# Patient Record
Sex: Male | Born: 1935 | Race: White | Hispanic: No | Marital: Married | Smoking: Never smoker
Health system: Southern US, Community
[De-identification: ages and names within clinical notes are randomized; demographics above are authoritative.]

## PROBLEM LIST (undated history)

## (undated) DIAGNOSIS — E079 Disorder of thyroid, unspecified: Secondary | ICD-10-CM

## (undated) DIAGNOSIS — I10 Essential (primary) hypertension: Secondary | ICD-10-CM

## (undated) DIAGNOSIS — I4891 Unspecified atrial fibrillation: Secondary | ICD-10-CM

## (undated) DIAGNOSIS — R059 Cough, unspecified: Secondary | ICD-10-CM

## (undated) DIAGNOSIS — I50812 Chronic right heart failure: Secondary | ICD-10-CM

## (undated) DIAGNOSIS — I2510 Atherosclerotic heart disease of native coronary artery without angina pectoris: Secondary | ICD-10-CM

## (undated) DIAGNOSIS — J90 Pleural effusion, not elsewhere classified: Secondary | ICD-10-CM

## (undated) DIAGNOSIS — E785 Hyperlipidemia, unspecified: Secondary | ICD-10-CM

## (undated) DIAGNOSIS — R05 Cough: Secondary | ICD-10-CM

## (undated) DIAGNOSIS — I639 Cerebral infarction, unspecified: Secondary | ICD-10-CM

## (undated) DIAGNOSIS — I519 Heart disease, unspecified: Secondary | ICD-10-CM

## (undated) HISTORY — DX: Atherosclerotic heart disease of native coronary artery without angina pectoris: I25.10

## (undated) HISTORY — DX: Hyperlipidemia, unspecified: E78.5

## (undated) HISTORY — PX: CHOLECYSTECTOMY: SHX55

## (undated) HISTORY — DX: Disorder of thyroid, unspecified: E07.9

## (undated) HISTORY — DX: Pleural effusion, not elsewhere classified: J90

## (undated) HISTORY — PX: CATARACT EXTRACTION: SUR2

## (undated) HISTORY — DX: Cough: R05

## (undated) HISTORY — DX: Heart disease, unspecified: I51.9

## (undated) HISTORY — DX: Unspecified atrial fibrillation: I48.91

## (undated) HISTORY — DX: Cough, unspecified: R05.9

## (undated) HISTORY — DX: Essential (primary) hypertension: I10

---

## 1992-08-10 HISTORY — PX: CORONARY ARTERY BYPASS GRAFT: SHX141

## 2003-07-11 ENCOUNTER — Encounter: Admission: RE | Admit: 2003-07-11 | Discharge: 2003-10-09 | Payer: Self-pay | Admitting: Family Medicine

## 2003-07-26 ENCOUNTER — Emergency Department (HOSPITAL_COMMUNITY): Admission: EM | Admit: 2003-07-26 | Discharge: 2003-07-26 | Payer: Self-pay | Admitting: Emergency Medicine

## 2004-01-19 ENCOUNTER — Inpatient Hospital Stay (HOSPITAL_COMMUNITY): Admission: EM | Admit: 2004-01-19 | Discharge: 2004-01-21 | Payer: Self-pay | Admitting: Emergency Medicine

## 2004-07-01 ENCOUNTER — Ambulatory Visit: Payer: Self-pay | Admitting: Internal Medicine

## 2004-07-09 ENCOUNTER — Ambulatory Visit: Payer: Self-pay

## 2004-07-14 ENCOUNTER — Ambulatory Visit: Payer: Self-pay | Admitting: Cardiology

## 2004-08-05 ENCOUNTER — Ambulatory Visit: Payer: Self-pay | Admitting: Cardiology

## 2004-08-26 ENCOUNTER — Ambulatory Visit: Payer: Self-pay | Admitting: Internal Medicine

## 2004-09-23 ENCOUNTER — Ambulatory Visit: Payer: Self-pay | Admitting: Cardiology

## 2004-10-07 ENCOUNTER — Ambulatory Visit: Payer: Self-pay | Admitting: *Deleted

## 2004-10-14 ENCOUNTER — Ambulatory Visit: Payer: Self-pay | Admitting: Cardiology

## 2004-10-17 ENCOUNTER — Ambulatory Visit: Payer: Self-pay | Admitting: Cardiology

## 2004-10-17 ENCOUNTER — Ambulatory Visit: Payer: Self-pay

## 2004-10-20 ENCOUNTER — Ambulatory Visit: Payer: Self-pay | Admitting: Cardiology

## 2004-11-10 ENCOUNTER — Ambulatory Visit: Payer: Self-pay | Admitting: Cardiology

## 2004-12-08 ENCOUNTER — Ambulatory Visit: Payer: Self-pay | Admitting: Cardiology

## 2004-12-22 ENCOUNTER — Ambulatory Visit: Payer: Self-pay | Admitting: Cardiology

## 2004-12-29 ENCOUNTER — Ambulatory Visit (HOSPITAL_BASED_OUTPATIENT_CLINIC_OR_DEPARTMENT_OTHER): Admission: RE | Admit: 2004-12-29 | Discharge: 2004-12-29 | Payer: Self-pay | Admitting: Urology

## 2004-12-29 ENCOUNTER — Ambulatory Visit (HOSPITAL_COMMUNITY): Admission: RE | Admit: 2004-12-29 | Discharge: 2004-12-29 | Payer: Self-pay | Admitting: Urology

## 2005-01-01 ENCOUNTER — Ambulatory Visit (HOSPITAL_COMMUNITY): Admission: RE | Admit: 2005-01-01 | Discharge: 2005-01-01 | Payer: Self-pay | Admitting: Urology

## 2005-01-14 ENCOUNTER — Ambulatory Visit: Payer: Self-pay | Admitting: Cardiology

## 2005-01-21 ENCOUNTER — Ambulatory Visit: Payer: Self-pay | Admitting: *Deleted

## 2005-01-28 ENCOUNTER — Ambulatory Visit: Payer: Self-pay | Admitting: *Deleted

## 2005-01-29 ENCOUNTER — Ambulatory Visit (HOSPITAL_COMMUNITY): Admission: RE | Admit: 2005-01-29 | Discharge: 2005-01-29 | Payer: Self-pay | Admitting: Urology

## 2005-02-05 ENCOUNTER — Ambulatory Visit: Payer: Self-pay | Admitting: Cardiology

## 2005-02-13 ENCOUNTER — Ambulatory Visit: Payer: Self-pay | Admitting: Cardiology

## 2005-02-25 ENCOUNTER — Ambulatory Visit: Payer: Self-pay | Admitting: Cardiovascular Disease

## 2005-02-26 ENCOUNTER — Ambulatory Visit (HOSPITAL_COMMUNITY): Admission: RE | Admit: 2005-02-26 | Discharge: 2005-02-26 | Payer: Self-pay | Admitting: Urology

## 2005-02-28 ENCOUNTER — Emergency Department (HOSPITAL_COMMUNITY): Admission: EM | Admit: 2005-02-28 | Discharge: 2005-02-28 | Payer: Self-pay | Admitting: Emergency Medicine

## 2005-03-04 ENCOUNTER — Inpatient Hospital Stay (HOSPITAL_COMMUNITY): Admission: EM | Admit: 2005-03-04 | Discharge: 2005-03-06 | Payer: Self-pay | Admitting: Urology

## 2005-03-04 ENCOUNTER — Ambulatory Visit: Payer: Self-pay | Admitting: Cardiology

## 2005-03-11 ENCOUNTER — Ambulatory Visit: Payer: Self-pay | Admitting: *Deleted

## 2005-03-20 ENCOUNTER — Ambulatory Visit: Payer: Self-pay | Admitting: Cardiology

## 2005-04-03 ENCOUNTER — Ambulatory Visit: Payer: Self-pay | Admitting: Internal Medicine

## 2007-11-17 ENCOUNTER — Encounter: Payer: Self-pay | Admitting: Cardiology

## 2010-03-25 ENCOUNTER — Emergency Department (HOSPITAL_BASED_OUTPATIENT_CLINIC_OR_DEPARTMENT_OTHER): Admission: EM | Admit: 2010-03-25 | Discharge: 2010-03-25 | Payer: Self-pay | Admitting: Emergency Medicine

## 2010-03-25 ENCOUNTER — Ambulatory Visit: Payer: Self-pay | Admitting: Diagnostic Radiology

## 2010-07-30 ENCOUNTER — Encounter: Payer: Self-pay | Admitting: Cardiology

## 2010-09-17 NOTE — Letter (Signed)
Summary: [facility]  [facility]   Imported By: Lenard Forth 09/10/2010 10:51:31  _____________________________________________________________________  External Attachment:    Type:   Image     Comment:   External Document

## 2010-12-26 NOTE — H&P (Signed)
NAMEPELHAM, HENNICK NO.:  [id number]   MEDICAL RECORD NO.:  [id number]          PATIENT TYPE:  INP   LOCATION:  1431                         FACILITY:  [facility]   PHYSICIAN:  Bertram Millard. Dahlstedt, M.D.DATE OF BIRTH:  Oct 21, 1923   DATE OF ADMISSION:  03/04/2005  DATE OF DISCHARGE:                                HISTORY & PHYSICAL   REASON FOR ADMISSION:  Feeling bad.   BRIEF HISTORY:  A 77 year old male who has had a significant amount of  difficulty with infections and stones recently.  He initially presented to  my attention in June, 2005 with a history of urinary retention.  That  required a catheter, and he failed a voiding trial.  He was continued on  Flomax and eventually voided.  He has had intermittent voiding symptoms  since that time as well as some microscopic hematuria and symptoms of  possible UTI.  Evaluation included an ultrasound showing significant left  hydro with a very large left renal pelvic stone.  He has obvious BPH.  The  patient has undergone three lithotripsies recently.  He has had a stent  placed, which has been indwelling.  The first and third lithotripsies had  significant success.  The second lithotripsy did not.  His most recent  lithotripsy was six days ago.  He was seen yesterday in the office.  At that  time, he had a recent history of an enterococcal UTI which was treated here  through the emergency room at [facility] on Saturday of this past week.  He  has been placed on Augmentin.  He had some low-grade temperatures.  KUB  yesterday revealed no significant residual fragments of the left kidney  stones, which measured at one point above 2 cm in size.   He went home yesterday, having been switched from Augmentin to  nitrofurantoin due to GI issues.  He presented today after he called in  because of fever to 101.6.  He just has not been feeling well and on  evaluation in the office today, he looked quite sick.  He is not  febrile,  but he looked pale.  Because of his age, medical condition, and recent  infection, it was recommended that he be admitted for further evaluation and  management.   PAST MEDICAL HISTORY:  1.  CVA.  He has right arm and leg partial paralysis.  2.  History of a CABG x4 in 1999.  3.  Back surgery in June, 2005, which precipitated his retention.  4.  He had lithotripsy three times in the past two months.  5.  History of hypothyroidism.  6.  Type 2 diabetes.  7.  History of melanoma which was excised three years ago.   MEDICATIONS:  1.  Macrobid 1 p.o. b.i.d.  2.  Glucophage 850 mg p.o. b.i.d.  3.  Levothyroxine 100 mcg p.o. q.a.m.  4.  Lovastatin 20 mg p.o. q.p.m.  5.  Atenolol 25 mg p.o. q.a.m.  6.  Coumadin 4 mg on Monday, Tuesday, and Thursday nights, 2 mg on Wednesday  and Friday nights.  7.  Proscar 5 mg daily.  8.  Flomax 0.4 mg q.h.s.   He denies any drug allergies except for STADOL.  He denies any latex  allergies.   He is married.  He has one daughter.  He is retired and was in [employer].  He is a native of [place], [place].   FAMILY HISTORY:  Significant for heart disease, diabetes, and hypertension.   REVIEW OF SYSTEMS:  He has had some weakness recently, shortness of breath.  He has also had some left flank discomfort since his lithotripsy.  He denies  any gross hematuria.  He denies any right-sided pain.  He denies any  vomiting in the past 2-3 days but has had some nausea.  He has had decreased  appetite.  He has had no chest pain.   PHYSICAL EXAMINATION:  VITAL SIGNS:  Blood pressure 142/83, pulse 102.  He  was afebrile.  He weighed 170 pounds.  GENERAL:  A mildly ill-appearing elderly male.  NECK:  Supple.  LUNGS:  Clear bilaterally.  HEART:  Slightly increased rhythm and increased rate with an irregular  rhythm.  ABDOMEN:  Mildly protuberant, soft, nondistended with minimal left CVA and  left lower quadrant tenderness.   No rebound or guarding.  No  hepatosplenomegaly or mass.  No significant flank hematoma noted.  GENITOURINARY:  External genitalia normal.  RECTAL:  Deferred.   ADMISSION DATA:  Hemoglobin was 8.4, hematocrit 24.8, white count 7900.  INR  2.7 with a pro time of 28.9.  BMET was normal except for a sodium of 134 and  a glucose of 154.  Urine culture from four days ago in the emergency room  grew Enterococcus sensitive to ampicillin, Levaquin, vancomycin, and  nitrofurantoin.   IMPRESSION:  1.  Urinary tract infection.  2.  A large left renal calculus, status post lithotripsy x3.  Now basically      stone-free except for a few fragments in his kidney.  3.  Benign prostatic hypertrophy with a history of retention.  4.  History of cerebrovascular accident.  5.  Atrial fibrillation.  6.  Hypothyroidism.   PLAN:  1.  Will admit and start on vancomycin for infection.  2.  While he is in the hospital, will possibly remove stent.  3.  Will recheck the patient's hemoglobin in the morning.  If it is still      low, will transfuse.  4.  Will add supplemental oxygen.       SMD/MEDQ  D:  03/04/2005  T:  03/04/2005  Job:  [id number]

## 2010-12-26 NOTE — Op Note (Signed)
NAMEORLEY, LAWRY NO.:  [id number]   MEDICAL RECORD NO.:  [id number]          PATIENT TYPE:  AMB   LOCATION:  NESC                         FACILITY:  [facility]   PHYSICIAN:  Bertram Millard. Dahlstedt, M.D.DATE OF BIRTH:  1924-02-18   DATE OF PROCEDURE:  12/29/2004  DATE OF DISCHARGE:                                 OPERATIVE REPORT   PREOPERATIVE DIAGNOSIS:  Left renal pelvic/staghorn calculus.   POSTOPERATIVE DIAGNOSIS:  Left renal pelvic/staghorn calculus.   PRINCIPAL PROCEDURE:  Cystoscopy, placement of double-J stent in left  ureter.   SURGEON:  Bertram Millard. Dahlstedt, M.D.   ANESTHESIA:  General with LMA.   COMPLICATIONS:  None.   BRIEF HISTORY:  A 77 year old male who has had intermittent urinary symptoms  as well as urinary infections over the past several months to a year. He had  urinary retention when I first saw him last summer. Evaluation was done  because of the patient's recurrent urinary tract infections and microscopic  hematuria. Additionally, he has had left flank pain. Last week I saw him and  he had a staghorn stone seen in this kidney. He presents at this time for  cystoscopy to place a stent. He is scheduled for lithotripsy later this  week. He is aware of the options of treatment including open surgery,  percutaneous nephrolithotomy and lithotripsy. He has chosen to have  lithotripsy.   DESCRIPTION OF PROCEDURE:  The patient was taken to the OR after  preoperative IV antibiotics were administered. General anesthetic was  administered using the LMA. He was placed in the dorsal lithotomy position.  Genitalia and perineum were prepped and draped. A 22-French panendoscope was  advanced into his bladder. Prostate had an enlarged median lobe with minimal  obstruction. Trabeculations were seen throughout. No tumors or foreign  bodies were noted. The left ureteral orifice was identified and a 0.38 inch  guidewire was placed and advanced past the  stone at the left renal pelvis.  Over top of this a 24 cm x 6-French double-J stent was placed. The string  was removed. Good curls were seen proximally and distally using fluoroscopic  and cystoscopic guidance after the guidewire was removed. The bladder was  then drained, the scope removed.   The patient tolerated procedure well. He was awakened, extubated, and taken  to the PACU in stable condition.      SMD/MEDQ  D:  12/29/2004  T:  12/29/2004  Job:  [id number]

## 2010-12-26 NOTE — Op Note (Signed)
NAME:  Eric Bowers, Eric Bowers NO.:  [id number]   MEDICAL RECORD NO.:  [id number]                   PATIENT TYPE:  INP   LOCATION:  5007                                 FACILITY:  [facility]   PHYSICIAN:  Tia Alert, MD                  DATE OF BIRTH:  1924/07/29   DATE OF PROCEDURE:  01/20/2004  DATE OF DISCHARGE:                                 OPERATIVE REPORT   PREOPERATIVE DIAGNOSES:  1. Lumbar spinal stenosis, L4-5 with lumbar disk herniation L4-5 on the     left.  2. Back pain.  3. Bilateral leg pain.  4. Urinary retention.   POSTOPERATIVE DIAGNOSES:  1. Lumbar spinal stenosis, L4-5 with lumbar disk herniation L4-5 on the     left.  2. Back pain.  3. Bilateral leg pain.  4. Urinary retention.   PROCEDURE:  1. Decompressive lumbar hemilaminectomy.  2. Medial facetectomy.  3. Foraminotomy at L4-5 on the left followed by sublaminar decompression L4-     5, central canal decompression, and right lateral recess decompression     and microdiskectomy at L4-5 on the left utilizing microscopic dissection.   SURGEON:  Dr. Marikay Alar.   ANESTHESIA:  General endotracheal.   COMPLICATIONS:  None apparent.   INDICATIONS FOR THE PROCEDURE:  Eric Bowers is a 77 year old white male, who  presented with a 2 week history of progressively worsening back pain to the  point that he could not get out of bed and ambulate.  He had one episode of  urinary incontinence and had a Foley catheter placed in the emergency  department when he presented with severe pain in his back and legs.  He had  a an MRI which showed severe spinal stenosis at L4-5 from a combination of  herniated disk L4-5 on the left, ligamentum flavum hypertrophy, and facet  arthropathy.  He was on Coumadin for atrial fibrillation and had multiple  comorbidities.  He was admitted, given FFP to reverse his coagulopathy, and  consented for a lumbar decompressive laminectomy at L4-5 with disk  __________  L4-5.  The patient understood the risks, benefits, and  alternatives and wished to proceed.   DESCRIPTION OF PROCEDURE:  The patient was taken to the operating room and  after induction of adequate generalized endotracheal anesthesia, he was  rolled in a prone position on the [employer], and all pressure points were  padded.  His lumbar region was prepped with DuraPrep and then draped in the  usual sterile fashion; 8 mL of local anesthesia was injected, and then a  dorsal midline incision was made over the L4-5 interspace, and the  paraspinous musculature was then taken down in a subperiosteal fashion to  expose the L4-5 interspace.  Internal x-ray confirmed our level, and then I  used a combination of the Kerrison punches and the high-speed air power  Black Max drill  to perform a hemilaminotomy, medial facetectomy, and  foraminotomy at L4-5 on the left.  I was then able to drill up under the  spinous process and then used the Kerrison punch to bite the __________ away  in the lateral recess on the right side out to the medial pedicle level to  perform a central canal and right lateral recess decompression to address  the central spinal stenosis.  Once the decompression was complete, I gently  retracted the L5 nerve root on the left medially and coagulated the epidural  venous vasculature and found a fairly significant subannular disk  herniation.  The annulus was incised with a 15 blade scalpel, and a thorough  intradiskal diskectomy was performed with pituitary rongeurs and curettes.  Once the diskectomy was completed, I irrigated with copious amounts of  Bacitracin-containing saline solution and inspected the nerve roots and the  midline once again with a coronary dilator palpater and then lined the dura  with Gelfoam, removed the retractor, dried all bleeding points, and then  closed the fascia with interrupted #1 Vicryl, closed the subcutaneous and  subcuticular tissue with 2-0 and  3-0 Vicryl, and closed the skin with  Dermabond.  The drapes were removed.  Sterile dressing was applied.  The  patient was awakened from general anesthesia and transported to the recovery  room in stable condition.  At the end of the procedure, all sponge, needle,  and instrument counts were correct.                                               Tia Alert, MD    DSJ/MEDQ  D:  01/20/2004  T:  01/20/2004  Job:  [id number]

## 2010-12-26 NOTE — Discharge Summary (Signed)
NAMEJUVENCIO, Eric Bowers NO.:  [id number]   MEDICAL RECORD NO.:  [id number]          PATIENT TYPE:  INP   LOCATION:  1431                         FACILITY:  [facility]   PHYSICIAN:  Bertram Millard. Dahlstedt, M.D.DATE OF BIRTH:  09/10/1923   DATE OF ADMISSION:  03/04/2005  DATE OF DISCHARGE:  03/06/2005                                 DISCHARGE SUMMARY   PRIMARY DIAGNOSES:  1.  Left perirenal hematoma.  2.  Left renal calculi.  3.  Left hydronephrosis.  4.  Benign prostatic hypertrophy.  5.  History of cerebrovascular accident.  6.  Coronary artery disease.  7.  Diabetes mellitus type 2.  8.  History of skin melanoma.  9.  History of urinary tract infection.   PROCEDURES.:  Transfusion.   BRIEF HISTORY:  [name] is a 77 year old gentleman who has had significant  difficulty with left renal calculi and sequelae there of. He initially  presented to my attention in June 2005 with urinary retention while in the  hospital. He was eventually taken off of catheter and has been voiding with  difficulty since that time. Evaluation for recurrent UTIs and microscopic  hematuria revealed a very large left renal pelvic stone with hydronephrosis.  He also has BPH.   The patient has undergone three lithotripsies, most recently just a week or  to ago. He recently presented to my office for follow-up and was found to  just not be feeling well at all. He was having low grade fevers, and his  energy level has diminished. He is on Coumadin for a history of CVA. He has  had a fever recently to 101.6.   For full history and physical, please see the dictated note from March 04, 2005.   HOSPITAL COURSE:  Urine culture from slightly before this admission revealed  enterococcus which was sensitive to ampicillin, nitrofurantoin, vancomycin,  and Levaquin. The patient was started on vancomycin. He was found to have a  hemoglobin of 8.4. The day after admission, his hemoglobin was down to  7.3,  with hydration. He was transfused a total of 4 units of packed red blood  cells while in the hospital. He was found to have a hematoma around his left  kidney on ultrasound.   After the patient was transfused, he felt much better. He actually received  a total of 5 units of blood. After 2 days of hospitalization, he felt much  better, he was afebrile, and he was felt ready for discharge.   Discharge medications include all his regular medications including Coumadin  plus Levaquin 250 mg once daily. He has 500 mg tablets at home and he will  split these in half. My office will call him to set up an appointment the  following week.      Bertram Millard. Dahlstedt, M.D.  Electronically Signed     SMD/MEDQ  D:  05/12/2005  T:  05/12/2005  Job:  [id number]

## 2010-12-26 NOTE — H&P (Signed)
NAME:  Eric Bowers, KIMBRELL NO.:  [id number]   MEDICAL RECORD NO.:  [id number]                   PATIENT TYPE:  INP   LOCATION:  5007                                 FACILITY:  [facility]   PHYSICIAN:  Tia Alert, MD                  DATE OF BIRTH:  1923-12-05   DATE OF ADMISSION:  01/19/2004  DATE OF DISCHARGE:                                HISTORY & PHYSICAL   CHIEF COMPLAINT:  Back pain.   HISTORY OF PRESENT ILLNESS:  [name] is a 77 year old white male with a  history of cerebrovascular accident, coronary artery bypass grafting,  hypertension, diabetes mellitus, hypothyroidism, and atrial fibrillation on  Coumadin who presents with a two week history of progressively worsening low  back pain which radiates into his bilateral lower extremities.  He states  the pain radiates into the posterior thighs.  He denies any significant  numbness and tingling.  He denies any perineal numbness or tingling.  However, he does report that  he had an episode of incontinence earlier  today, however, he has urinated while in the emergency department.  He  denies any fever and chills.  He does complain of some dysuria, but his  urinalysis in the emergency department was negative for infection.  He  states that the pain has gotten progressively worse.  He takes Vicodin and  Flexeril for the pain and has seen a chiropractor, however, it has gotten  progressively worse to the point that he could not get out of bed and  ambulate today.  He had an MRI in the emergency department which showed  severe spinal stenosis with a herniated disk at L4-L5.  A neurosurgical  consultation was requested.   PAST MEDICAL HISTORY:  1. Appendectomy.  2. Tonsillectomy.  3. Coronary artery disease, status post coronary artery bypass grafting.  4. Dyslipidemia.  5. Melanoma, status post removal 2-3 years ago.  6. Adult onset diabetes mellitus.  7. Cerebrovascular accident with right sided  weakness.  8. Hypertension.  9. Hyperthyroidism.  10.      Atrial fibrillation.   MEDICATIONS:  1. Aspirin 81 mg per day.  2. Atenolol 25 mg p.o. b.i.d.  3. Lipitor 20 mg p.o. every day.  4. Synthroid 100 mcg p.o. every day.  5. Flexeril p.r.n.  6. Vicodin p.r.n.  7. Glucophage 850 mg p.o. b.i.d.  8. Coumadin 4 mg p.o. every day.   ALLERGIES:  STADOL.   SOCIAL HISTORY:  He denies the use of tobacco or alcohol products.   PHYSICAL EXAMINATION:  VITAL SIGNS:  Temperature 97.7, pulse 97,  respirations 18, oxygen saturation 98%.  GENERAL:  A very pleasant, cooperative, white male in no acute distress.  HEENT:  Normocephalic, atraumatic.  Extraocular movements are intact without  nystagmus.  He does have a mild right facial droop.  NECK:  Supple.  HEART:  Regular rate and  rhythm.  ABDOMEN:  Benign.  EXTREMITIES:  Show no clubbing, cyanosis, or edema.  NEUROLOGIC:  He is awake and alert.  He is oriented x 4.  There is no  aphasia.  He has good attention span.  Again, he has some mild right facial  weakness but his tongue protrudes in the midline.  He can puff out his  cheeks.  He has good shoulder shrug.  His strength is decreased on the right  side, and he is spastic on his right side, face and arm greater than leg.  His strength is not quite antigravity on the right side.  His left side has  good strength in his upper and lower extremity with good muscle tone and  bulk.  He has a positive Hoffmann sign on the right, negative on the left.  Sensation is okay.  His gait is not tested.  His rectal tone is normal.   IMAGING STUDIES:  He had an MRI of the lumbar spine which I have reviewed as  well as the radiologist report.  It shows degenerative disk disease  throughout with some mild spondylosis throughout, except at L4-L5 where he  has quite severe stenosis from broad based disk herniation paracentral to  the left, ligamentum flavum hypertrophy, and facette hypertrophy.    ASSESSMENT/PLAN:  This is a 77 year old white male who has severe spinal  stenosis at L4-L5 with severe back pain and bilateral leg pain, especially  when standing.  He has gotten to the point that he can not get out of bed  because of his pain.  He has severe spinal stenosis at L4-L5.  I recommend a  decompressive laminectomy at L4-L5 with diskectomy at L4-L5 on the left.  He  understands the risks of the surgery including bleeding, infection,  __________ injury, CSF leak, lack of release symptoms, worsening symptoms,  and anesthesia risk, and he wishes to proceed.  We will admit him and place  him on Decadron for the pain.  We will cover him with sliding scale insulin  for his diabetes mellitus.  He will be NPO after midnight and we will  consent him for the surgery.  We will also take him off of his Coumadin and  reverse his coagulopathy with fresh frozen plasma.  We will also hold his  aspirin.  Because he has urinary incontinence, we will check his post void  residual in the emergency department, and if his residual is greater than  300 cc, we will leave the Foley catheter in.                                                Tia Alert, MD    DSJ/MEDQ  D:  01/19/2004  T:  01/20/2004  Job:  [id number]

## 2011-05-04 ENCOUNTER — Telehealth: Payer: Self-pay | Admitting: Cardiology

## 2011-05-04 NOTE — Telephone Encounter (Signed)
Please fax last OV Note, EKG, ECHO, STRESS, if at all available.

## 2011-05-04 NOTE — Telephone Encounter (Signed)
Faxed over last OV Note, EKG, and Stress Test that were available today.

## 2011-06-05 ENCOUNTER — Institutional Professional Consult (permissible substitution): Payer: Self-pay | Admitting: Internal Medicine

## 2011-06-12 ENCOUNTER — Encounter: Payer: Self-pay | Admitting: Internal Medicine

## 2011-06-15 ENCOUNTER — Institutional Professional Consult (permissible substitution): Payer: Self-pay | Admitting: Internal Medicine

## 2011-09-07 ENCOUNTER — Ambulatory Visit (INDEPENDENT_AMBULATORY_CARE_PROVIDER_SITE_OTHER): Payer: Medicare Other | Admitting: Physician Assistant

## 2011-09-07 ENCOUNTER — Encounter: Payer: Self-pay | Admitting: Physician Assistant

## 2011-09-07 DIAGNOSIS — R600 Localized edema: Secondary | ICD-10-CM

## 2011-09-07 DIAGNOSIS — E785 Hyperlipidemia, unspecified: Secondary | ICD-10-CM

## 2011-09-07 DIAGNOSIS — I10 Essential (primary) hypertension: Secondary | ICD-10-CM | POA: Insufficient documentation

## 2011-09-07 DIAGNOSIS — I482 Chronic atrial fibrillation, unspecified: Secondary | ICD-10-CM

## 2011-09-07 DIAGNOSIS — I4891 Unspecified atrial fibrillation: Secondary | ICD-10-CM

## 2011-09-07 DIAGNOSIS — E119 Type 2 diabetes mellitus without complications: Secondary | ICD-10-CM

## 2011-09-07 DIAGNOSIS — I519 Heart disease, unspecified: Secondary | ICD-10-CM

## 2011-09-07 DIAGNOSIS — R609 Edema, unspecified: Secondary | ICD-10-CM

## 2011-09-07 DIAGNOSIS — E039 Hypothyroidism, unspecified: Secondary | ICD-10-CM

## 2011-09-07 DIAGNOSIS — I639 Cerebral infarction, unspecified: Secondary | ICD-10-CM

## 2011-09-07 HISTORY — DX: Heart disease, unspecified: I51.9

## 2011-09-07 LAB — POCT INR: INR: 3

## 2011-09-07 LAB — POCT GLYCOSYLATED HEMOGLOBIN (HGB A1C): Hemoglobin A1C: 7.1

## 2011-09-07 NOTE — Patient Instructions (Addendum)
Labs drawn.. Will call with results. Follow up in 1 month for Coumadin Recheck. Follow up 1 month to talk more about depression. Take lasix twice a week for swelling.

## 2011-09-07 NOTE — Progress Notes (Signed)
Subjective:    Patient ID: Eric Bowers, male    DOB: [birth year], 78 y.o.   MRN: [id number]  HPI Patient presents to the clinic to establish care today. His doctor moved in any for one to manage his medications. Patient reports he had bypass surgery on his heart 25 years ago and since has been on Coumadin. He takes Coumadin 3 mg 1-1/2 tabs on Saturday and Sunday with 1 tab on all the other days. He has been on this dose for a long time. He recently had a procedure to drain fluid off his abdomen and had to go off Coumadin. He went back on the Coumadin 2 weeks ago.   He is a chronic problem of swelling of his extremities but does not like to take diuretics. His wife states that she has to make him take frusemide when his legs get really swollen. He has compression hose but his wife in not get them on him.  He has been diagnosed with diabetes but it has been well controlled in the past. He went off metformin 6 months ago because of the good readings. He has changed his portion sizes but nothing else.  We talked about depression today. He he reports that he does still male and somewhat hopeless. He lost about $100,000 a couple years ago with the market crash and this bothers him. He also has had 2 of his daughters died. He has trouble sleeping at night because he thinks about everything in his life. His wife states that he evening gets mad at her for no reason. He has not been on medication in the past for depression.   He does not need  refills today.   Review of Systems     Objective:   Physical Exam  Constitutional: He is oriented to person, place, and time. He appears well-developed and well-nourished.  HENT:  Head: Normocephalic and atraumatic.  Cardiovascular: Normal rate and intact distal pulses.        Atrial fibrillation with normal rate. No murmurs detected. 1+ pitting edema bilateral legs.  Pulmonary/Chest: Effort normal and breath sounds normal. He has no wheezes.    Musculoskeletal:       Right side weakness in both arm and leg extremities due to stroke history.  Neurological: He is alert and oriented to person, place, and time.  Skin:       Venous statis of bilateral legs. Multiple brusing in all extremities.   Psychiatric: His behavior is normal.       Flat affect.          Assessment & Plan:  Swelling of bilateral legs-ordered CMP to check renal function. We'll call with results. Patient has HCTZ and Lasix. He is unaware if she takes these on a daily basis. His wife states she has to make him take fluid pills. He does have compression stockings but they are too hard to get on. Instructed patient to start taking Lasix twice a week to help decrease swelling. The swelling does decrease where he can put compression stockings on that would also help.  Depression-PHQ-9 was 7. patient did not want to take any more pills especially for depression. I discussed with patient the option of psychotherapy with a counselor he declined. Follow-up in one month. Spoke with wife about patient's depression with his permission and she was very concerned. She states that he has changed and just does not want to do anything anymore. He has even threatened to leave her recently and  she does not even know where he would go.  Hypothyroidism-ordered TSH. We'll call with results.  Diabetes mellitus type 2-hemoglobin A1c 7.2. Patient has not own any medications for diabetes. Does not want to go back on the metformin right now. He wants to try to control his sugars with diet and portion control.  Atrial fibrillation, controlled-on Coumadin. INR was 3 today. Keep on same dose and recheck in 1 month.

## 2011-09-08 LAB — COMPREHENSIVE METABOLIC PANEL
Alkaline Phosphatase: 126 U/L — ABNORMAL HIGH (ref 39–117)
CO2: 20 mEq/L (ref 19–32)
Glucose, Bld: 102 mg/dL — ABNORMAL HIGH (ref 70–99)
Sodium: 140 mEq/L (ref 135–145)
Total Bilirubin: 2.1 mg/dL — ABNORMAL HIGH (ref 0.3–1.2)

## 2011-09-08 LAB — LIPID PANEL
Cholesterol: 90 mg/dL (ref 0–200)
Total CHOL/HDL Ratio: 3 Ratio
VLDL: 13 mg/dL (ref 0–40)

## 2011-09-11 ENCOUNTER — Other Ambulatory Visit: Payer: Self-pay | Admitting: Physician Assistant

## 2011-09-11 MED ORDER — LEVOTHYROXINE SODIUM 137 MCG PO CAPS: 1.0000 | ORAL_CAPSULE | ORAL | Status: DC

## 2011-09-14 ENCOUNTER — Other Ambulatory Visit: Payer: Self-pay | Admitting: *Deleted

## 2011-09-14 MED ORDER — LEVOTHYROXINE SODIUM 137 MCG PO CAPS: 1.0000 | ORAL_CAPSULE | ORAL | Status: DC

## 2011-09-17 ENCOUNTER — Ambulatory Visit: Payer: Self-pay | Admitting: Family Medicine

## 2011-09-22 ENCOUNTER — Telehealth: Payer: Self-pay | Admitting: *Deleted

## 2011-09-22 NOTE — Telephone Encounter (Signed)
Pt would like to know

## 2011-09-24 ENCOUNTER — Other Ambulatory Visit: Payer: Self-pay | Admitting: Physician Assistant

## 2011-10-05 ENCOUNTER — Encounter: Payer: Self-pay | Admitting: Physician Assistant

## 2011-10-05 ENCOUNTER — Ambulatory Visit (INDEPENDENT_AMBULATORY_CARE_PROVIDER_SITE_OTHER): Payer: Medicare Other | Admitting: Physician Assistant

## 2011-10-05 VITALS — BP 119/59 | HR 71 | Ht 70.0 in | Wt 198.0 lb

## 2011-10-05 DIAGNOSIS — I4891 Unspecified atrial fibrillation: Secondary | ICD-10-CM

## 2011-10-05 DIAGNOSIS — I83009 Varicose veins of unspecified lower extremity with ulcer of unspecified site: Secondary | ICD-10-CM

## 2011-10-05 DIAGNOSIS — L97909 Non-pressure chronic ulcer of unspecified part of unspecified lower leg with unspecified severity: Secondary | ICD-10-CM

## 2011-10-05 DIAGNOSIS — I639 Cerebral infarction, unspecified: Secondary | ICD-10-CM

## 2011-10-05 DIAGNOSIS — R600 Localized edema: Secondary | ICD-10-CM

## 2011-10-05 DIAGNOSIS — R609 Edema, unspecified: Secondary | ICD-10-CM

## 2011-10-05 LAB — POCT INR: INR: 3.2

## 2011-10-05 MED ORDER — BENAZEPRIL HCL 40 MG PO TABS: 40.0000 mg | ORAL_TABLET | Freq: Every day | ORAL | Status: DC

## 2011-10-05 MED ORDER — CHLORHEXIDINE GLUCONATE 4 % EX LIQD: 60.0000 mL | Freq: Every day | CUTANEOUS | Status: AC | PRN

## 2011-10-05 NOTE — Patient Instructions (Addendum)
Stop HCTZ. Continue with Lasix 40mg  daily. Will order an ABI to check for any peripheral artery disease. 3mg  every day except Wednesday and Satuday. Recheck INR in 2 weeks. Gave rx for hibiclens to use to clean ulcers on left leg continue to put bactroban on leg after cleaning.

## 2011-10-05 NOTE — Progress Notes (Signed)
  Subjective:    Patient ID: Eric Bowers, male    DOB: 12-08-1923, 78 y.o.   MRN: [id number]  HPI Patient presents to clinic because of his swollen legs and coumadin recheck. At last visit he was taken off lasix but left on HCTZ. His legs began to swell and leak and he tried to call but no one would take his msg. He legs have continued to get worse and the left leg now has an ulcer that is leaking. He finally started lasix back 2 days ago and started applying Bactroban on leg and it has helped a lot. He also needs to have INR checked today.     Review of Systems     Objective:   Physical Exam  Constitutional: He is oriented to person, place, and time.  HENT:  Head: Normocephalic and atraumatic.  Cardiovascular: Normal rate, regular rhythm and normal heart sounds.        Pedal pulse diminished bilaterally.  Pulmonary/Chest: Effort normal and breath sounds normal. He has no wheezes. He has no rales.  Neurological: He is alert and oriented to person, place, and time.  Skin:       Left leg ulcer 3cm by 2cm with yellow thin drainage and area of erythema surrounding. Edema 3+ pitting edema bilaterally.  Right leg no open ulcers edema present. No drainage.  Psychiatric: Thought content normal.       Very irritated because they did not let him leave a msg for me. The whole interview he was upset because his legs have gotten worse.          Assessment & Plan:  Atrial Fibrillation/Stroke- INR 3.2 decrease dose to 3mg  every day except Wednesday and Saturday. Recheck in 2 weeks.  Venous Statis ulcer(left leg)/Edema in both legs- Not infected today. Stop HCTZ. Continue with Lasix 40mg  daily. Elevated legs. Will order an ABI to check for any peripheral artery disease before suggesting compression stockings. Gave rx for hibiclens to use to clean ulcers on left leg continue to put bactroban on leg after cleaning and keep covered with moist dressings. If improving does not need to see me at 2  weeks when INR is checked.   Fall Risk Assess today: at 39. High Risk.

## 2011-10-15 ENCOUNTER — Other Ambulatory Visit: Payer: Self-pay | Admitting: Physician Assistant

## 2011-10-15 DIAGNOSIS — L97919 Non-pressure chronic ulcer of unspecified part of right lower leg with unspecified severity: Secondary | ICD-10-CM

## 2011-10-19 ENCOUNTER — Ambulatory Visit: Payer: Medicare Other | Admitting: Physician Assistant

## 2011-10-19 ENCOUNTER — Ambulatory Visit (INDEPENDENT_AMBULATORY_CARE_PROVIDER_SITE_OTHER): Payer: Medicare Other | Admitting: Physician Assistant

## 2011-10-19 VITALS — BP 122/68 | HR 80

## 2011-10-19 DIAGNOSIS — E039 Hypothyroidism, unspecified: Secondary | ICD-10-CM

## 2011-10-19 DIAGNOSIS — I872 Venous insufficiency (chronic) (peripheral): Secondary | ICD-10-CM

## 2011-10-19 DIAGNOSIS — I4891 Unspecified atrial fibrillation: Secondary | ICD-10-CM

## 2011-10-19 NOTE — Progress Notes (Addendum)
  Subjective:    Patient ID: Eric Bowers, male    DOB: 1923-08-19, 78 y.o.   MRN: [id number] INR check HPI Patient reports legs are doing much better. He has not done ABI and does not want to do more testing. He wants to see if he can have fluid drained off of lungs but not willing to do procedures necessary to have done. Patient is very frustrated and feels like we are not doing anything to help him.  He has been reading side effect profiles about medications and does not want to take synthyroid anymore because he thinks that is the reason his legs are swelling.    Review of Systems     Objective:   Physical Exam  Constitutional: He is oriented to person, place, and time. He appears well-developed and well-nourished.  HENT:  Head: Normocephalic and atraumatic.  Cardiovascular:       Afib.   Pulmonary/Chest: Effort normal.       Coarse breath sounds.  Neurological: He is alert and oriented to person, place, and time.  Skin:       Bilateral legs are reddish purple in appearance with 2+ pitting edema. Right leg with no ulcers. Left with 3 ulcers on lateral side of leg.   Psychiatric:       Very frustrated and not compliant with procedures and testing wanting to be done.          Assessment & Plan:  Chronic venous insuffiencey bilaterally- encouraged patient to get ABI so we could better treat him. Continue on Lasix, clean with hibiclens, and keep feet elevated as much as possible.   hypothryoidism-Encouraged patient that synthyroid is not the reason for his legs swelling and told him that it would be beneficial to continue taking.   History of pleural effusion- Patient has seen pulmonology and not been compliant with testing. Patient does not want any testing done and just wants procedure done. I informed him that anywhere I send there will be testing before procedures are done.   INR-2.7 today. Recheck in 1 week.

## 2011-10-19 NOTE — Progress Notes (Signed)
Addended by: Jomarie Longs on: 10/19/2011 01:16 PM   Modules accepted: Level of Service

## 2011-10-26 ENCOUNTER — Ambulatory Visit (INDEPENDENT_AMBULATORY_CARE_PROVIDER_SITE_OTHER): Payer: Medicare Other | Admitting: Physician Assistant

## 2011-10-26 VITALS — BP 103/55 | HR 80

## 2011-10-26 DIAGNOSIS — I4891 Unspecified atrial fibrillation: Secondary | ICD-10-CM

## 2011-10-26 LAB — POCT INR: INR: 2.9

## 2011-10-26 NOTE — Progress Notes (Signed)
  Subjective:    Patient ID: Eric Bowers, male    DOB: 04/01/1924, 78 y.o.   MRN: [id number] INR HPI    Review of Systems     Objective:   Physical Exam        Assessment & Plan:  Coumadin decreased and to recheck in 2 weeks.

## 2011-11-09 ENCOUNTER — Other Ambulatory Visit: Payer: Self-pay | Admitting: Physician Assistant

## 2011-11-09 ENCOUNTER — Ambulatory Visit: Payer: Medicare Other

## 2012-03-21 ENCOUNTER — Other Ambulatory Visit: Payer: Self-pay | Admitting: Physician Assistant

## 2014-10-08 ENCOUNTER — Telehealth: Payer: Self-pay | Admitting: Family Medicine

## 2014-10-08 NOTE — Telephone Encounter (Signed)
EDP: Be[facility]Boyko [facility] Reason for Transfer: LE cellulitis/AKI/Hypothyroidism/Family request PCP: Marsh DollyKorrington  -[facility]. Also w/ bilateral LE cellulitis. Redirected [facility] [place]. Multilpe medical problems. Afib, CVA., HLD, DM, HTN, CRF. VS:T 98.1, BP 107/55, HR 100s. Being treated with vanc and rocephin. EDP feels pt needs stepdown- may also need central line and critical care. After discussion, EDP feels [facility] Will redirect there.  Labs:Cr-3.6, WBC 6.4, TSH 20, BNP 7k

## 2014-11-12 ENCOUNTER — Emergency Department (HOSPITAL_COMMUNITY): Payer: Medicare Other

## 2014-11-12 ENCOUNTER — Encounter (HOSPITAL_COMMUNITY): Payer: Self-pay | Admitting: Nurse Practitioner

## 2014-11-12 ENCOUNTER — Other Ambulatory Visit (HOSPITAL_COMMUNITY): Payer: Self-pay

## 2014-11-12 ENCOUNTER — Inpatient Hospital Stay (HOSPITAL_COMMUNITY)
Admission: EM | Admit: 2014-11-12 | Discharge: 2014-11-16 | DRG: 682 | Disposition: A | Discharge status: Skilled Nursing Facility | Payer: Medicare Other | Attending: Internal Medicine | Admitting: Internal Medicine

## 2014-11-12 DIAGNOSIS — R609 Edema, unspecified: Secondary | ICD-10-CM | POA: Diagnosis not present

## 2014-11-12 DIAGNOSIS — I4891 Unspecified atrial fibrillation: Secondary | ICD-10-CM | POA: Diagnosis present

## 2014-11-12 DIAGNOSIS — D696 Thrombocytopenia, unspecified: Secondary | ICD-10-CM | POA: Diagnosis present

## 2014-11-12 DIAGNOSIS — E785 Hyperlipidemia, unspecified: Secondary | ICD-10-CM | POA: Diagnosis present

## 2014-11-12 DIAGNOSIS — I10 Essential (primary) hypertension: Secondary | ICD-10-CM | POA: Diagnosis not present

## 2014-11-12 DIAGNOSIS — I5033 Acute on chronic diastolic (congestive) heart failure: Secondary | ICD-10-CM | POA: Diagnosis present

## 2014-11-12 DIAGNOSIS — E039 Hypothyroidism, unspecified: Secondary | ICD-10-CM | POA: Diagnosis present

## 2014-11-12 DIAGNOSIS — E1129 Type 2 diabetes mellitus with other diabetic kidney complication: Secondary | ICD-10-CM

## 2014-11-12 DIAGNOSIS — Z79899 Other long term (current) drug therapy: Secondary | ICD-10-CM

## 2014-11-12 DIAGNOSIS — I272 Other secondary pulmonary hypertension: Secondary | ICD-10-CM | POA: Diagnosis present

## 2014-11-12 DIAGNOSIS — N184 Chronic kidney disease, stage 4 (severe): Secondary | ICD-10-CM | POA: Diagnosis not present

## 2014-11-12 DIAGNOSIS — I69951 Hemiplegia and hemiparesis following unspecified cerebrovascular disease affecting right dominant side: Secondary | ICD-10-CM

## 2014-11-12 DIAGNOSIS — I129 Hypertensive chronic kidney disease with stage 1 through stage 4 chronic kidney disease, or unspecified chronic kidney disease: Secondary | ICD-10-CM | POA: Diagnosis present

## 2014-11-12 DIAGNOSIS — N50819 Testicular pain, unspecified: Secondary | ICD-10-CM

## 2014-11-12 DIAGNOSIS — I482 Chronic atrial fibrillation: Secondary | ICD-10-CM | POA: Diagnosis present

## 2014-11-12 DIAGNOSIS — I2510 Atherosclerotic heart disease of native coronary artery without angina pectoris: Secondary | ICD-10-CM | POA: Diagnosis present

## 2014-11-12 DIAGNOSIS — E1165 Type 2 diabetes mellitus with hyperglycemia: Secondary | ICD-10-CM

## 2014-11-12 DIAGNOSIS — R109 Unspecified abdominal pain: Secondary | ICD-10-CM

## 2014-11-12 DIAGNOSIS — E860 Dehydration: Secondary | ICD-10-CM | POA: Diagnosis present

## 2014-11-12 DIAGNOSIS — Z951 Presence of aortocoronary bypass graft: Secondary | ICD-10-CM

## 2014-11-12 DIAGNOSIS — IMO0002 Reserved for concepts with insufficient information to code with codable children: Secondary | ICD-10-CM

## 2014-11-12 DIAGNOSIS — E1122 Type 2 diabetes mellitus with diabetic chronic kidney disease: Secondary | ICD-10-CM

## 2014-11-12 DIAGNOSIS — D631 Anemia in chronic kidney disease: Secondary | ICD-10-CM | POA: Diagnosis present

## 2014-11-12 DIAGNOSIS — I509 Heart failure, unspecified: Secondary | ICD-10-CM

## 2014-11-12 DIAGNOSIS — E119 Type 2 diabetes mellitus without complications: Secondary | ICD-10-CM | POA: Diagnosis present

## 2014-11-12 DIAGNOSIS — N189 Chronic kidney disease, unspecified: Secondary | ICD-10-CM

## 2014-11-12 DIAGNOSIS — R601 Generalized edema: Secondary | ICD-10-CM | POA: Diagnosis present

## 2014-11-12 HISTORY — DX: Chronic right heart failure: I50.812

## 2014-11-12 LAB — URINALYSIS, ROUTINE W REFLEX MICROSCOPIC
BILIRUBIN URINE: NEGATIVE
GLUCOSE, UA: NEGATIVE mg/dL
Hgb urine dipstick: NEGATIVE
KETONES UR: NEGATIVE mg/dL
Leukocytes, UA: NEGATIVE
Nitrite: NEGATIVE
PROTEIN: NEGATIVE mg/dL
Specific Gravity, Urine: 1.02 (ref 1.005–1.030)
Urobilinogen, UA: 0.2 mg/dL (ref 0.0–1.0)
pH: 5 (ref 5.0–8.0)

## 2014-11-12 LAB — CBC WITH DIFFERENTIAL/PLATELET
BASOS PCT: 0 % (ref 0–1)
Basophils Absolute: 0 10*3/uL (ref 0.0–0.1)
EOS ABS: 0.5 10*3/uL (ref 0.0–0.7)
EOS PCT: 12 % — AB (ref 0–5)
HEMATOCRIT: 26.6 % — AB (ref 39.0–52.0)
Hemoglobin: 8.5 g/dL — ABNORMAL LOW (ref 13.0–17.0)
Lymphocytes Relative: 23 % (ref 12–46)
Lymphs Abs: 1 10*3/uL (ref 0.7–4.0)
MCH: 30.5 pg (ref 26.0–34.0)
MCHC: 32 g/dL (ref 30.0–36.0)
MCV: 95.3 fL (ref 78.0–100.0)
MONO ABS: 0.4 10*3/uL (ref 0.1–1.0)
MONOS PCT: 9 % (ref 3–12)
NEUTROS PCT: 56 % (ref 43–77)
Neutro Abs: 2.5 10*3/uL (ref 1.7–7.7)
Platelets: 131 10*3/uL — ABNORMAL LOW (ref 150–400)
RBC: 2.79 MIL/uL — ABNORMAL LOW (ref 4.22–5.81)
RDW: 15.9 % — ABNORMAL HIGH (ref 11.5–15.5)
WBC: 4.5 10*3/uL (ref 4.0–10.5)

## 2014-11-12 LAB — COMPREHENSIVE METABOLIC PANEL
ALT: 13 U/L (ref 0–53)
AST: 16 U/L (ref 0–37)
Albumin: 3.1 g/dL — ABNORMAL LOW (ref 3.5–5.2)
Alkaline Phosphatase: 148 U/L — ABNORMAL HIGH (ref 39–117)
Anion gap: 9 (ref 5–15)
BUN: 79 mg/dL — ABNORMAL HIGH (ref 6–23)
CALCIUM: 8.4 mg/dL (ref 8.4–10.5)
CO2: 16 mmol/L — AB (ref 19–32)
Chloride: 114 mmol/L — ABNORMAL HIGH (ref 96–112)
Creatinine, Ser: 2.69 mg/dL — ABNORMAL HIGH (ref 0.50–1.35)
GFR calc Af Amer: 23 mL/min — ABNORMAL LOW (ref >90)
GFR calc non Af Amer: 20 mL/min — ABNORMAL LOW (ref >90)
Glucose, Bld: 146 mg/dL — ABNORMAL HIGH (ref 70–99)
Potassium: 4.8 mmol/L (ref 3.5–5.1)
SODIUM: 139 mmol/L (ref 135–145)
TOTAL PROTEIN: 6.3 g/dL (ref 6.0–8.3)
Total Bilirubin: 0.6 mg/dL (ref 0.3–1.2)

## 2014-11-12 LAB — PROTIME-INR
INR: 1.31 (ref 0.00–1.49)
Prothrombin Time: 16.4 seconds — ABNORMAL HIGH (ref 11.6–15.2)

## 2014-11-12 LAB — TROPONIN I: Troponin I: <0.03 ng/mL (ref <0.031)

## 2014-11-12 LAB — BRAIN NATRIURETIC PEPTIDE: B NATRIURETIC PEPTIDE 5: 192.2 pg/mL — AB (ref 0.0–100.0)

## 2014-11-12 MED ORDER — FUROSEMIDE 40 MG PO TABS: 20.0000 mg | ORAL_TABLET | Freq: Every day | ORAL | Status: DC | PRN

## 2014-11-12 MED ORDER — METOPROLOL TARTRATE 12.5 MG HALF TABLET
12.5000 mg | ORAL_TABLET | Freq: Two times a day (BID) | ORAL | Status: DC
  Administered 2014-11-13: 12.5 mg via ORAL
  Filled 2014-11-12 (×3): qty 1

## 2014-11-12 MED ORDER — BUMETANIDE 1 MG PO TABS
1.0000 mg | ORAL_TABLET | Freq: Two times a day (BID) | ORAL | Status: DC
  Administered 2014-11-13 – 2014-11-14 (×3): 1 mg via ORAL
  Filled 2014-11-12 (×5): qty 1

## 2014-11-12 MED ORDER — COLCHICINE 0.6 MG PO TABS
0.6000 mg | ORAL_TABLET | ORAL | Status: DC | PRN
  Filled 2014-11-12: qty 1

## 2014-11-12 MED ORDER — FUROSEMIDE 10 MG/ML IJ SOLN
20.0000 mg | Freq: Once | INTRAMUSCULAR | Status: AC
Start: 2014-11-12 — End: 2014-11-12
  Administered 2014-11-12: 20 mg via INTRAVENOUS
  Filled 2014-11-12: qty 4

## 2014-11-12 MED ORDER — SIMVASTATIN 40 MG PO TABS
40.0000 mg | ORAL_TABLET | Freq: Every day | ORAL | Status: DC
  Administered 2014-11-13 – 2014-11-15 (×3): 40 mg via ORAL
  Filled 2014-11-12 (×4): qty 1

## 2014-11-12 MED ORDER — HEPARIN SODIUM (PORCINE) 5000 UNIT/ML IJ SOLN
5000.0000 [IU] | Freq: Three times a day (TID) | INTRAMUSCULAR | Status: DC
  Administered 2014-11-13 – 2014-11-16 (×11): 5000 [IU] via SUBCUTANEOUS
  Filled 2014-11-12 (×13): qty 1

## 2014-11-12 MED ORDER — LEVOTHYROXINE SODIUM 137 MCG PO TABS
137.0000 ug | ORAL_TABLET | Freq: Every day | ORAL | Status: DC
  Administered 2014-11-13 – 2014-11-16 (×4): 137 ug via ORAL
  Filled 2014-11-12 (×7): qty 1

## 2014-11-12 MED ORDER — FINASTERIDE 5 MG PO TABS
5.0000 mg | ORAL_TABLET | Freq: Every day | ORAL | Status: DC
  Administered 2014-11-13 – 2014-11-16 (×4): 5 mg via ORAL
  Filled 2014-11-12 (×4): qty 1

## 2014-11-12 MED ORDER — MORPHINE SULFATE 2 MG/ML IJ SOLN
2.0000 mg | Freq: Once | INTRAMUSCULAR | Status: AC
Start: 2014-11-12 — End: 2014-11-12
  Administered 2014-11-12: 2 mg via INTRAVENOUS
  Filled 2014-11-12: qty 1

## 2014-11-12 MED ORDER — VITAMIN B-12 1000 MCG PO TABS
1000.0000 ug | ORAL_TABLET | Freq: Every day | ORAL | Status: DC
  Administered 2014-11-13 – 2014-11-16 (×4): 1000 ug via ORAL
  Filled 2014-11-12 (×4): qty 1

## 2014-11-12 MED ORDER — ALLOPURINOL 100 MG PO TABS
100.0000 mg | ORAL_TABLET | Freq: Every day | ORAL | Status: DC
  Administered 2014-11-13 – 2014-11-16 (×4): 100 mg via ORAL
  Filled 2014-11-12 (×4): qty 1

## 2014-11-12 MED ORDER — TAMSULOSIN HCL 0.4 MG PO CAPS
0.4000 mg | ORAL_CAPSULE | Freq: Every day | ORAL | Status: DC
  Administered 2014-11-13 – 2014-11-16 (×4): 0.4 mg via ORAL
  Filled 2014-11-12 (×4): qty 1

## 2014-11-12 NOTE — ED Notes (Signed)
Patient is trying to urinate, urinal was given to patient

## 2014-11-12 NOTE — H&P (Signed)
[facility] History and Physical  Eric Bowers ZOX:[id number] DOB: Jan 15, 1924 DOA: 11/12/2014  Referring physician: EDP PCP: Tandy Gaw, PA-C   Chief Complaint: Scrotal edema   HPI: Eric Bowers is a 81 y.o. male with history of CKD stage 4 in setting of DM2.  The patient was recently seen [facility] at the beginning of March, and his creatinine and BUN on discharge at that time were 2.3 and 58 respectively, his bicarb was 20.  He has been at a rehab getting stronger with intent to return to independent living.  Apparently at the rehab over the last couple of days his BUN has been running in the 70s to 80s, and his creatinine was 2.5-2.6.  Patient and granddaughter state that apparently they felt that he was "dehydrated" and so ordered him 2L of IVF.  Unfortunately this just caused worsening peripheral edema and does not appear to have helped his kidney function at all.  Review of Systems: Systems reviewed.  As above, otherwise negative  Past Medical History  Diagnosis Date  . Cough   . Diabetes mellitus   . CAD (coronary artery disease)   . Atrial fibrillation   . Pleural effusion   . Hyperlipidemia   . Hypertension   . Thyroid disease   . Heart disease 09/07/11   History reviewed. No pertinent past surgical history. Social History:  reports that he has never smoked. He does not have any smokeless tobacco history on file. His alcohol and drug histories are not on file.  Allergies  Allergen Reactions  . Butorphanol     Feels like climbing the walls    History reviewed. No pertinent family history.   Prior to Admission medications   Medication Sig Start Date End Date Taking? Authorizing Provider  allopurinol (ZYLOPRIM) 100 MG tablet Take 100 mg by mouth daily. 09/11/14  Yes Historical Provider, MD  colchicine 0.6 MG tablet Take 0.6 mg by mouth as needed (gout).  06/12/14  Yes Historical Provider, MD  finasteride (PROSCAR) 5 MG tablet Take 5 mg by mouth daily.  06/27/14  Yes Historical Provider, MD  furosemide (LASIX) 40 MG tablet Take 20 mg by mouth daily as needed for fluid or edema.    Yes Historical Provider, MD  levothyroxine (SYNTHROID, LEVOTHROID) 137 MCG tablet TAKE 1 TABLET (137 MCG TOTAL) BY MOUTH 1 DAY OR 1 DOSE. Patient taking differently: TAKE 1 TABLET BY MOUTH DAILY 11/09/11  Yes Jade L Breeback, PA-C  metoprolol tartrate (LOPRESSOR) 25 MG tablet Take 12.5 mg by mouth 2 (two) times daily. 10/17/14 10/17/15 Yes Historical Provider, MD  simvastatin (ZOCOR) 40 MG tablet Take 40 mg by mouth at bedtime.     Yes Historical Provider, MD  Tamsulosin HCl (FLOMAX) 0.4 MG CAPS Take 0.4 mg by mouth daily.     Yes Historical Provider, MD  vitamin B-12 (CYANOCOBALAMIN) 1000 MCG tablet Take 1,000 mcg by mouth daily.   Yes Historical Provider, MD   Physical Exam: Filed Vitals:   11/12/14 2200  BP: 100/63  Pulse: 72  Temp:   Resp:     BP 100/63 mmHg  Pulse 72  Temp(Src) 98.8 F (37.1 C) (Oral)  Resp 16  SpO2 96%  General Appearance:    Alert, oriented, no distress, appears stated age  Head:    Normocephalic, atraumatic  Eyes:    PERRL, EOMI, sclera non-icteric        Nose:   Nares without drainage or epistaxis. Mucosa, turbinates normal  Throat:  Moist mucous membranes. Oropharynx without erythema or exudate.  Neck:   Supple. No carotid bruits.  No thyromegaly.  No lymphadenopathy.   Back:     No CVA tenderness, no spinal tenderness  Lungs:     Clear to auscultation bilaterally, without wheezes, rhonchi or rales  Chest wall:    No tenderness to palpitation  Heart:    Regular rate and rhythm without murmurs, gallops, rubs  Abdomen:     Soft, non-tender, nondistended, normal bowel sounds, no organomegaly  Genitalia:    deferred  Rectal:    deferred  Extremities:   4+ pitting edema up to scrotum  Pulses:   2+ and symmetric all extremities  Skin:   Skin color, texture, turgor normal, no rashes or lesions  Lymph nodes:   Cervical,  supraclavicular, and axillary nodes normal  Neurologic:   CNII-XII intact. Normal strength, sensation and reflexes      throughout    Labs on Admission:  Basic Metabolic Panel:  Recent Labs Lab 11/12/14 1628  NA 139  K 4.8  CL 114*  CO2 16*  GLUCOSE 146*  BUN 79*  CREATININE 2.69*  CALCIUM 8.4   Liver Function Tests:  Recent Labs Lab 11/12/14 1628  AST 16  ALT 13  ALKPHOS 148*  BILITOT 0.6  PROT 6.3  ALBUMIN 3.1*   No results for input(s): LIPASE, AMYLASE in the last 168 hours. No results for input(s): AMMONIA in the last 168 hours. CBC:  Recent Labs Lab 11/12/14 1628  WBC 4.5  NEUTROABS 2.5  HGB 8.5*  HCT 26.6*  MCV 95.3  PLT 131*   Cardiac Enzymes:  Recent Labs Lab 11/12/14 1628  TROPONINI <0.03    BNP (last 3 results) No results for input(s): PROBNP in the last 8760 hours. CBG: No results for input(s): GLUCAP in the last 168 hours.  Radiological Exams on Admission: Dg Chest 2 View  11/12/2014   CLINICAL DATA:  Shortness of breath. Cough and congestion. Wheezing for 2 days.  EXAM: CHEST  2 VIEW  COMPARISON:  01/20/2004  FINDINGS: Mild enlargement of the cardiopericardial silhouette with atherosclerotic calcification of the thoracic aorta. Prior CABG.  Small bilateral pleural effusions. Tortuous descending thoracic aorta.  Density projecting over the cardiac shadow likely represents combination of tortuous aorta pleural effusion. Hiatal hernia could also appear this way but is not readily seen on the lateral projection.  Chronic fracture of the right proximal humeral shaft.  IMPRESSION: 1. Cardiomegaly with small bilateral pleural effusions. 2. Tortuous thoracic aorta. Effusion and aorta likely accounts for the retrocardiac density although a hiatal hernia could also have this appearance. 3. Chronic fracture, right proximal humerus.   Electronically Signed   By: Gaylyn RongWalter  Liebkemann M.D.   On: 11/12/2014 17:31   [place] Scrotum  11/12/2014   CLINICAL DATA:   Bilateral testicle pain. Pain with changing positions. Red and swollen testicles bilaterally. History of diabetes and hypertension.  EXAM: SCROTAL ULTRASOUND  DOPPLER ULTRASOUND OF THE TESTICLES  TECHNIQUE: Complete ultrasound examination of the testicles, epididymis, and other scrotal structures was performed. Color and spectral Doppler ultrasound were also utilized to evaluate blood flow to the testicles.  COMPARISON:  None.  FINDINGS: Right testicle  Measurements: 4.0 x 2.7 x 2.4 cm. No mass or microlithiasis visualized.  Left testicle  Measurements: 3.8 x 2.3 x 2.6 cm. No mass or microlithiasis visualized.  Right epididymis: Small epididymal cyst/spermatocele is 0.9 x 0.8 x 0.7 cm. The epididymal head is mildly prominent without suspicious  mass. No associated hypervascularity.  Left epididymis:  Normal in size and appearance.  Hydrocele:  Small right hydrocele present.  Varicocele:  Bilateral varicoceles are noted.  Pulsed Doppler interrogation of both testes demonstrates normal low resistance arterial and venous waveforms bilaterally.  Additional findings:  There is marked skin thickening of the scrotum, left greater than right. Marked edema is identified without evidence for abscess.  IMPRESSION: 1. No evidence for intra-testicular mass or torsion. 2. Small right epididymal cyst/spermatocele. 3. Significant hyperemia and edema of the scrotum, left greater than right. No definite abscess identified.   Electronically Signed   By: Norva Pavlov M.D.   On: 11/12/2014 21:18   [place] Art/ven Flow Abd Pelv Doppler  11/12/2014   CLINICAL DATA:  Bilateral testicle pain. Pain with changing positions. Red and swollen testicles bilaterally. History of diabetes and hypertension.  EXAM: SCROTAL ULTRASOUND  DOPPLER ULTRASOUND OF THE TESTICLES  TECHNIQUE: Complete ultrasound examination of the testicles, epididymis, and other scrotal structures was performed. Color and spectral Doppler ultrasound were also utilized to  evaluate blood flow to the testicles.  COMPARISON:  None.  FINDINGS: Right testicle  Measurements: 4.0 x 2.7 x 2.4 cm. No mass or microlithiasis visualized.  Left testicle  Measurements: 3.8 x 2.3 x 2.6 cm. No mass or microlithiasis visualized.  Right epididymis: Small epididymal cyst/spermatocele is 0.9 x 0.8 x 0.7 cm. The epididymal head is mildly prominent without suspicious mass. No associated hypervascularity.  Left epididymis:  Normal in size and appearance.  Hydrocele:  Small right hydrocele present.  Varicocele:  Bilateral varicoceles are noted.  Pulsed Doppler interrogation of both testes demonstrates normal low resistance arterial and venous waveforms bilaterally.  Additional findings:  There is marked skin thickening of the scrotum, left greater than right. Marked edema is identified without evidence for abscess.  IMPRESSION: 1. No evidence for intra-testicular mass or torsion. 2. Small right epididymal cyst/spermatocele. 3. Significant hyperemia and edema of the scrotum, left greater than right. No definite abscess identified.   Electronically Signed   By: Norva Pavlov M.D.   On: 11/12/2014 21:18    EKG: Independently reviewed.  Assessment/Plan Principal Problem:   CKD (chronic kidney disease) stage 4, GFR 15-29 ml/min Active Problems:   Hypertension   DM (diabetes mellitus)   Atrial fibrillation   Peripheral edema   1. Peripheral edema - due to administration of IVF 1. Will put patient back on home dose of  Bumex BID that he was discharged from [facility] from 2. CKD stage 4 - patient actually wants a new nephrologist according to daughter, will go ahead and admit and get nephrology consult in AM. 3. RTA - Likely needs PO bicarb, will defer to nephrology 4. Anemia of CKD - needs nephrology consult and likely will need epogyn at point in near future, his HGB of 8.5 today is not really changed much from the 8.8 at time of discharge from [facility] on 10/14/14. 5. DM - appears diet  controlled, carb mod diet    Code Status: Full Code  Family Communication: Spoke with daughter on phone Disposition Plan: Admit to obs   Time spent: 70 min  Geraldine Sandberg M. [facility] Pager [phone]  If 7AM-7PM, please contact the day team taking care of the patient [email or web address] Password [email or web address] 11/12/2014, 11:16 PM

## 2014-11-12 NOTE — ED Notes (Signed)
Family at bedside.  Family requesting a room not in hallway. Informed pt and family there were no available rooms. Pt and family verbalized  understanding and could witnessed like kind in hallway.

## 2014-11-12 NOTE — ED Notes (Signed)
Patient is still unable to urinate 

## 2014-11-12 NOTE — ED Notes (Signed)
Pt's grand daughter Marylene Landngela requests to be updated on pt's admission cell phone [phone].

## 2014-11-12 NOTE — Progress Notes (Signed)
CSW met with pt at bedside. Pt appears to be  Hard of hearing. There was no family present.  Per note, the pt is from [facility]. Patient states that he is here due to there being too much fluid around his heart.  Note states, pt is here due to [place] requesting further evaluation after he received a lab result of 5488 for BNP.  Patient appeared to be confused when once CSW asked was he able to complete his own ADL's. However, he states that he can bath himself independently. " I can shower." Patient did not answer if he received assistance with eating and putting on clothes.Once asked about support system; Patient stated that his wife and grandchildren are in [place].  Willette Brace [phone] ED CSW 11/12/2014 4:40 PM

## 2014-11-12 NOTE — ED Notes (Signed)
Pt presents [facility], pt PCP  Dr. Arlyce DiceKaplan requested pt be sent for further evaluation after he received a lab result of 5488 for BNP, pt is asymptomatic at this time denying shortness of breath or chest pain. No lethargy noted on initial triage assessment, medics gave 5 mg Albuterol en route, no wheezing on auscultation, vital signs unremarkable.

## 2014-11-12 NOTE — ED Notes (Signed)
[facility] Expected date:  Expected time:  Means of arrival:  Comments: abd swelling, high kidney labs

## 2014-11-12 NOTE — ED Provider Notes (Signed)
CSN: [id number]     Arrival date & time 11/12/14  1509 History   First MD Initiated Contact with Patient 11/12/14 1616     Chief Complaint  Patient presents with  . Abnormal Lab    BNP 5488     (Consider location/radiation/quality/duration/timing/severity/associated sxs/prior Treatment) HPI Comments: Patient from nursing facility with shortness of breath and elevated BNP. Patient reportedly has had worsening abdominal swelling and scrotal swelling overnight. He endorses some shortness of breath. He denies any chest pain, cough or fever. Family reports he has a history of renal disease and atrial fibrillation. He denies any history of heart failure. He takes Coumadin for history of atrial fibrillation. He is normally able to lay flat at baseline. He denies anyfocal weakness, numbness or tingling. Denies any bowel or bladder incontinence.   The history is provided by the patient.    Past Medical History  Diagnosis Date  . Cough   . Diabetes mellitus   . CAD (coronary artery disease)   . Atrial fibrillation   . Pleural effusion   . Hyperlipidemia   . Hypertension   . Thyroid disease   . Heart disease 09/07/11   History reviewed. No pertinent past surgical history. History reviewed. No pertinent family history. History  Substance Use Topics  . Smoking status: Never Smoker   . Smokeless tobacco: Not on file  . Alcohol Use: Not on file    Review of Systems  Constitutional: Negative for fever, activity change, appetite change and fatigue.  HENT: Negative for congestion and rhinorrhea.   Eyes: Negative for visual disturbance.  Respiratory: Positive for shortness of breath. Negative for chest tightness.   Cardiovascular: Negative for chest pain.  Gastrointestinal: Negative for nausea, vomiting and abdominal pain.  Genitourinary: Positive for penile swelling, scrotal swelling and testicular pain.  Musculoskeletal: Negative for myalgias and arthralgias.  Skin: Negative for rash.   Neurological: Negative for weakness, light-headedness and headaches.  A complete 10 system review of systems was obtained and all systems are negative except as noted in the HPI and PMH.      Allergies  Butorphanol  Home Medications   Prior to Admission medications   Medication Sig Start Date End Date Taking? Authorizing Provider  allopurinol (ZYLOPRIM) 100 MG tablet Take 100 mg by mouth daily. 09/11/14  Yes Historical Provider, MD  colchicine 0.6 MG tablet Take 0.6 mg by mouth as needed (gout).  06/12/14  Yes Historical Provider, MD  finasteride (PROSCAR) 5 MG tablet Take 5 mg by mouth daily. 06/27/14  Yes Historical Provider, MD  furosemide (LASIX) 40 MG tablet Take 20 mg by mouth daily as needed for fluid or edema.    Yes Historical Provider, MD  levothyroxine (SYNTHROID, LEVOTHROID) 137 MCG tablet TAKE 1 TABLET (137 MCG TOTAL) BY MOUTH 1 DAY OR 1 DOSE. Patient taking differently: TAKE 1 TABLET BY MOUTH DAILY 11/09/11  Yes Jade L Breeback, PA-C  metoprolol tartrate (LOPRESSOR) 25 MG tablet Take 12.5 mg by mouth 2 (two) times daily. 10/17/14 10/17/15 Yes Historical Provider, MD  simvastatin (ZOCOR) 40 MG tablet Take 40 mg by mouth at bedtime.     Yes Historical Provider, MD  Tamsulosin HCl (FLOMAX) 0.4 MG CAPS Take 0.4 mg by mouth daily.     Yes Historical Provider, MD  vitamin B-12 (CYANOCOBALAMIN) 1000 MCG tablet Take 1,000 mcg by mouth daily.   Yes Historical Provider, MD   BP 101/47 mmHg  Pulse 72  Temp(Src) 97.1 F (36.2 C) (Axillary)  Resp 22  SpO2 98% Physical Exam  Constitutional: He is oriented to person, place, and time. He appears well-developed and well-nourished. No distress.  HENT:  Head: Normocephalic and atraumatic.  Mouth/Throat: Oropharynx is clear and moist. No oropharyngeal exudate.  Eyes: Conjunctivae and EOM are normal. Pupils are equal, round, and reactive to light.  Neck: Normal range of motion. Neck supple.  Cardiovascular: Normal rate, regular rhythm and  normal heart sounds.   Pulmonary/Chest: Effort normal. No respiratory distress. He has rales.  Decreased breath sounds at the bases  Abdominal: Soft. He exhibits distension. There is tenderness. There is no rebound and no guarding.  Abdominal edema  Genitourinary: Penile tenderness present.  Scrotal edema and tenderness  Musculoskeletal: Normal range of motion. He exhibits edema. He exhibits no tenderness.  Neurological: He is alert and oriented to person, place, and time. No cranial nerve deficit. He exhibits normal muscle tone. Coordination normal.  Skin: Skin is warm.    ED Course  Procedures (including critical care time) Labs Review Labs Reviewed  CBC WITH DIFFERENTIAL/PLATELET - Abnormal; Notable for the following:    RBC 2.79 (*)    Hemoglobin 8.5 (*)    HCT 26.6 (*)    RDW 15.9 (*)    Platelets 131 (*)    Eosinophils Relative 12 (*)    All other components within normal limits  COMPREHENSIVE METABOLIC PANEL - Abnormal; Notable for the following:    Chloride 114 (*)    CO2 16 (*)    Glucose, Bld 146 (*)    BUN 79 (*)    Creatinine, Ser 2.69 (*)    Albumin 3.1 (*)    Alkaline Phosphatase 148 (*)    GFR calc non Af Amer 20 (*)    GFR calc Af Amer 23 (*)    All other components within normal limits  BRAIN NATRIURETIC PEPTIDE - Abnormal; Notable for the following:    B Natriuretic Peptide 192.2 (*)    All other components within normal limits  PROTIME-INR - Abnormal; Notable for the following:    Prothrombin Time 16.4 (*)    All other components within normal limits  TROPONIN I  URINALYSIS, ROUTINE W REFLEX MICROSCOPIC  BASIC METABOLIC PANEL    Imaging Review Dg Chest 2 View  11/12/2014   CLINICAL DATA:  Shortness of breath. Cough and congestion. Wheezing for 2 days.  EXAM: CHEST  2 VIEW  COMPARISON:  01/20/2004  FINDINGS: Mild enlargement of the cardiopericardial silhouette with atherosclerotic calcification of the thoracic aorta. Prior CABG.  Small bilateral  pleural effusions. Tortuous descending thoracic aorta.  Density projecting over the cardiac shadow likely represents combination of tortuous aorta pleural effusion. Hiatal hernia could also appear this way but is not readily seen on the lateral projection.  Chronic fracture of the right proximal humeral shaft.  IMPRESSION: 1. Cardiomegaly with small bilateral pleural effusions. 2. Tortuous thoracic aorta. Effusion and aorta likely accounts for the retrocardiac density although a hiatal hernia could also have this appearance. 3. Chronic fracture, right proximal humerus.   Electronically Signed   By: Gaylyn Rong M.D.   On: 11/12/2014 17:31   US Abdomen Complete  11/13/2014   CLINICAL DATA:  Abdominal pain  EXAM: ULTRASOUND ABDOMEN COMPLETE  COMPARISON:  Abdominal CT 08/28/2005  FINDINGS: Gallbladder: Partly shadowing echogenic structure most consistent with stone. No wall thickening or focal tenderness.  Common bile duct: Diameter: Limited visualization, with no evidence of dilatation.  Liver: In homogeneous liver without focal mass lesion or clear cirrhotic  lobulation. There is antegrade flow in the imaged portal venous system.  IVC: No abnormality visualized.  Pancreas: Visualized portion unremarkable.  Spleen: Size and appearance within normal limits.  Right Kidney: Length: 11 cm.  No mass or hydronephrosis visualized.  Left Kidney: Length: 8 cm. There is limited visualization due to narrow sonographic windows. No evidence of hydronephrosis.  Abdominal aorta: Only the proximal portion is visible, normal in diameter 2.5 cm.  Other findings: Small ascites noted around the liver. Small right pleural effusion.  IMPRESSION: 1. Technically challenging and limited study.  No acute findings. 2. Cholelithiasis without acute cholecystitis. 3. Left renal atrophy.   Electronically Signed   By: Marnee SpringJonathon  Watts M.D.   On: 11/13/2014 00:23   [place] Scrotum  11/12/2014   CLINICAL DATA:  Bilateral testicle pain. Pain with  changing positions. Red and swollen testicles bilaterally. History of diabetes and hypertension.  EXAM: SCROTAL ULTRASOUND  DOPPLER ULTRASOUND OF THE TESTICLES  TECHNIQUE: Complete ultrasound examination of the testicles, epididymis, and other scrotal structures was performed. Color and spectral Doppler ultrasound were also utilized to evaluate blood flow to the testicles.  COMPARISON:  None.  FINDINGS: Right testicle  Measurements: 4.0 x 2.7 x 2.4 cm. No mass or microlithiasis visualized.  Left testicle  Measurements: 3.8 x 2.3 x 2.6 cm. No mass or microlithiasis visualized.  Right epididymis: Small epididymal cyst/spermatocele is 0.9 x 0.8 x 0.7 cm. The epididymal head is mildly prominent without suspicious mass. No associated hypervascularity.  Left epididymis:  Normal in size and appearance.  Hydrocele:  Small right hydrocele present.  Varicocele:  Bilateral varicoceles are noted.  Pulsed Doppler interrogation of both testes demonstrates normal low resistance arterial and venous waveforms bilaterally.  Additional findings:  There is marked skin thickening of the scrotum, left greater than right. Marked edema is identified without evidence for abscess.  IMPRESSION: 1. No evidence for intra-testicular mass or torsion. 2. Small right epididymal cyst/spermatocele. 3. Significant hyperemia and edema of the scrotum, left greater than right. No definite abscess identified.   Electronically Signed   By: Norva PavlovElizabeth  Brown M.D.   On: 11/12/2014 21:18   [place] Art/ven Flow Abd Pelv Doppler  11/12/2014   CLINICAL DATA:  Bilateral testicle pain. Pain with changing positions. Red and swollen testicles bilaterally. History of diabetes and hypertension.  EXAM: SCROTAL ULTRASOUND  DOPPLER ULTRASOUND OF THE TESTICLES  TECHNIQUE: Complete ultrasound examination of the testicles, epididymis, and other scrotal structures was performed. Color and spectral Doppler ultrasound were also utilized to evaluate blood flow to the testicles.   COMPARISON:  None.  FINDINGS: Right testicle  Measurements: 4.0 x 2.7 x 2.4 cm. No mass or microlithiasis visualized.  Left testicle  Measurements: 3.8 x 2.3 x 2.6 cm. No mass or microlithiasis visualized.  Right epididymis: Small epididymal cyst/spermatocele is 0.9 x 0.8 x 0.7 cm. The epididymal head is mildly prominent without suspicious mass. No associated hypervascularity.  Left epididymis:  Normal in size and appearance.  Hydrocele:  Small right hydrocele present.  Varicocele:  Bilateral varicoceles are noted.  Pulsed Doppler interrogation of both testes demonstrates normal low resistance arterial and venous waveforms bilaterally.  Additional findings:  There is marked skin thickening of the scrotum, left greater than right. Marked edema is identified without evidence for abscess.  IMPRESSION: 1. No evidence for intra-testicular mass or torsion. 2. Small right epididymal cyst/spermatocele. 3. Significant hyperemia and edema of the scrotum, left greater than right. No definite abscess identified.   Electronically Signed  By: Norva Pavlov M.D.   On: 11/12/2014 21:18     EKG Interpretation   Date/Time:  Monday November 12 2014 17:38:52 EDT Ventricular Rate:  73 PR Interval:    QRS Duration: 78 QT Interval:  406 QTC Calculation: 447 R Axis:   57 Text Interpretation:  Atrial fibrillation with a competing junctional  pacemaker Low voltage QRS Nonspecific ST abnormality , probably digitalis  effect Abnormal ECG No significant change was found Confirmed by Manus Gunning   MD, Jewel Venditto [phone]) on 11/12/2014 5:50:09 PM      MDM   Final diagnoses:  Testicle pain  Abdominal pain   Patient from nursing home with shortness of breath, elevated BNP. No chest pain.  Labs remarkable for acute kidney injury.  The patient was recently seen at [facility] at the beginning of March, and his creatinine and BUN on discharge at that time were 2.3 and 58 respectively, his bicarb was 20.  His creatinine and BUN are  slightly worse today.  Worsening nonanion gap acidosis.  He was reportedly given IVF at the rehab center for "dehydration".  US obtained given patient's complaint of significant testicular pain. No evidence of torsion or abscess. CXR without effusion. Peripheral edema seems to be from poor renal function.  No significant pulmonary edema. Acidosis and uremia worse than on discharge last month. Will admit for observation and early nephrology consultation.  D/w Dr. Julian Reil.    Glynn Octave, MD 11/13/14 [phone]

## 2014-11-12 NOTE — ED Notes (Signed)
Bed: WA24 Expected date:  Expected time:  Means of arrival:  Comments: Hold  

## 2014-11-12 NOTE — ED Notes (Signed)
Encouraged urine sample. Pt is unable to go at this time. Family aware to call RN back to 20 minutes and will notify the EDP

## 2014-11-12 NOTE — ED Notes (Signed)
Revonda StandardAllison at radiology notified that pt has been moved from a hall bed to an ED room

## 2014-11-13 DIAGNOSIS — N184 Chronic kidney disease, stage 4 (severe): Secondary | ICD-10-CM | POA: Diagnosis not present

## 2014-11-13 DIAGNOSIS — I482 Chronic atrial fibrillation: Secondary | ICD-10-CM

## 2014-11-13 DIAGNOSIS — I10 Essential (primary) hypertension: Secondary | ICD-10-CM | POA: Diagnosis not present

## 2014-11-13 DIAGNOSIS — R609 Edema, unspecified: Secondary | ICD-10-CM | POA: Diagnosis not present

## 2014-11-13 LAB — BASIC METABOLIC PANEL
Anion gap: 8 (ref 5–15)
BUN: 80 mg/dL — ABNORMAL HIGH (ref 6–23)
CO2: 17 mmol/L — ABNORMAL LOW (ref 19–32)
Calcium: 8.3 mg/dL — ABNORMAL LOW (ref 8.4–10.5)
Chloride: 115 mmol/L — ABNORMAL HIGH (ref 96–112)
Creatinine, Ser: 2.66 mg/dL — ABNORMAL HIGH (ref 0.50–1.35)
GFR calc non Af Amer: 20 mL/min — ABNORMAL LOW (ref >90)
GFR, EST AFRICAN AMERICAN: 23 mL/min — AB (ref >90)
GLUCOSE: 117 mg/dL — AB (ref 70–99)
Potassium: 4.5 mmol/L (ref 3.5–5.1)
Sodium: 140 mmol/L (ref 135–145)

## 2014-11-13 MED ORDER — FUROSEMIDE 10 MG/ML IJ SOLN
40.0000 mg | Freq: Once | INTRAMUSCULAR | Status: AC
Start: 2014-11-13 — End: 2014-11-13
  Administered 2014-11-13: 40 mg via INTRAVENOUS
  Filled 2014-11-13: qty 4

## 2014-11-13 NOTE — Progress Notes (Signed)
Bladder scanned patient - 184 ml shown. Notified MD.

## 2014-11-13 NOTE — Progress Notes (Signed)
[facility] PROGRESS NOTE  Eric Bowers:[id number] DOB: 08/02/1924 DOA: 11/12/2014 PCP: Tandy Gaw, PA-C  Assessment/Plan: 1. Extremity edema/volume overload -Patient was recently hydrated with IV fluids after which 5 members reporting development of significant bilateral lower extremity and scrotal edema. -On physical exam this morning continues to appear volume overloaded -Patient having a net negative fluid balance of 625 mL, will give 40 mg of IV Lasix today, monitor ins and outs -Continue to monitor kidney function.  2.  Stage IV chronic kidney disease. -Patient having little change in kidney function overnight after receiving IV Lasix in the emergency department -He continues to appear volume overloaded on physical exam having 3+ bilateral extremity pitting edema and scrotal edema -Will give 40 mg of IV Lasix today -Follow-up on a.m. BMP  3.  Hypertension. -Patient's blood pressure soft, having blood pressure 101/60 earlier today. -Will hold his metoprolol 12.5 mg by mouth twice a day for now -Continue to monitor blood pressures.  4.  Dyslipidemia -Continue simvastatin 40 mg by mouth daily  Code Status: Full code Family Communication: I spoke to his wife and daughter who were present at bedside Disposition Plan: Patient receiving IV diuresis, anticipate discharge to skilled nursing facility when medically stable   HPI/Subjective: Patient is a pleasant 81 year old gentleman with a past medical history CKD stage 4, recent hospitalization at [facility], having a creatinine of 2.3 and BUN 58 on discharge. Patient was discharged to skilled nursing facility where it appears he received 2 L of IV fluids. He was transferred to the emergency department at [facility] on 11/12/2014 for worsening bilateral lower extremity edema associated with scrotal edema. Patient was given IV Lasix.  Objective: Filed Vitals:   11/13/14 1359  BP: 112/65  Pulse: 75   Temp: 98.1 F (36.7 C)  Resp: 19    Intake/Output Summary (Last 24 hours) at 11/13/14 1701 Last data filed at 11/13/14 1019  Gross per 24 hour  Intake    240 ml  Output    875 ml  Net   -635 ml   There were no vitals filed for this visit.  Exam:   General:  Patient is awake and alert, following commands, states ongoing leg swelling  Cardiovascular: Regular rate and rhythm normal S1-S2  Respiratory: Coarse respiratory sounds, has a few active tori wheezes, few bibasilar crackles  Abdomen: Soft nontender nondistended  Musculoskeletal: 3+ bilateral extremity pitting edema along with scrotal edema present   Data Reviewed: Basic Metabolic Panel:  Recent Labs Lab 11/12/14 1628 11/13/14 0505  NA 139 140  K 4.8 4.5  CL 114* 115*  CO2 16* 17*  GLUCOSE 146* 117*  BUN 79* 80*  CREATININE 2.69* 2.66*  CALCIUM 8.4 8.3*   Liver Function Tests:  Recent Labs Lab 11/12/14 1628  AST 16  ALT 13  ALKPHOS 148*  BILITOT 0.6  PROT 6.3  ALBUMIN 3.1*   No results for input(s): LIPASE, AMYLASE in the last 168 hours. No results for input(s): AMMONIA in the last 168 hours. CBC:  Recent Labs Lab 11/12/14 1628  WBC 4.5  NEUTROABS 2.5  HGB 8.5*  HCT 26.6*  MCV 95.3  PLT 131*   Cardiac Enzymes:  Recent Labs Lab 11/12/14 1628  TROPONINI <0.03   BNP (last 3 results)  Recent Labs  11/12/14 1628  BNP 192.2*    ProBNP (last 3 results) No results for input(s): PROBNP in the last 8760 hours.  CBG: No results for input(s): GLUCAP in the last  168 hours.  No results found for this or any previous visit (from the past 240 hour(s)).   Studies: Dg Chest 2 View  11/12/2014   CLINICAL DATA:  Shortness of breath. Cough and congestion. Wheezing for 2 days.  EXAM: CHEST  2 VIEW  COMPARISON:  01/20/2004  FINDINGS: Mild enlargement of the cardiopericardial silhouette with atherosclerotic calcification of the thoracic aorta. Prior CABG.  Small bilateral pleural effusions.  Tortuous descending thoracic aorta.  Density projecting over the cardiac shadow likely represents combination of tortuous aorta pleural effusion. Hiatal hernia could also appear this way but is not readily seen on the lateral projection.  Chronic fracture of the right proximal humeral shaft.  IMPRESSION: 1. Cardiomegaly with small bilateral pleural effusions. 2. Tortuous thoracic aorta. Effusion and aorta likely accounts for the retrocardiac density although a hiatal hernia could also have this appearance. 3. Chronic fracture, right proximal humerus.   Electronically Signed   By: Gaylyn RongWalter  Liebkemann M.D.   On: 11/12/2014 17:31   [place] Abdomen Complete  11/13/2014   CLINICAL DATA:  Abdominal pain  EXAM: ULTRASOUND ABDOMEN COMPLETE  COMPARISON:  Abdominal CT 08/28/2005  FINDINGS: Gallbladder: Partly shadowing echogenic structure most consistent with stone. No wall thickening or focal tenderness.  Common bile duct: Diameter: Limited visualization, with no evidence of dilatation.  Liver: In homogeneous liver without focal mass lesion or clear cirrhotic lobulation. There is antegrade flow in the imaged portal venous system.  IVC: No abnormality visualized.  Pancreas: Visualized portion unremarkable.  Spleen: Size and appearance within normal limits.  Right Kidney: Length: 11 cm.  No mass or hydronephrosis visualized.  Left Kidney: Length: 8 cm. There is limited visualization due to narrow sonographic windows. No evidence of hydronephrosis.  Abdominal aorta: Only the proximal portion is visible, normal in diameter 2.5 cm.  Other findings: Small ascites noted around the liver. Small right pleural effusion.  IMPRESSION: 1. Technically challenging and limited study.  No acute findings. 2. Cholelithiasis without acute cholecystitis. 3. Left renal atrophy.   Electronically Signed   By: Marnee SpringJonathon  Watts M.D.   On: 11/13/2014 00:23   [place] Scrotum  11/12/2014   CLINICAL DATA:  Bilateral testicle pain. Pain with changing positions.  Red and swollen testicles bilaterally. History of diabetes and hypertension.  EXAM: SCROTAL ULTRASOUND  DOPPLER ULTRASOUND OF THE TESTICLES  TECHNIQUE: Complete ultrasound examination of the testicles, epididymis, and other scrotal structures was performed. Color and spectral Doppler ultrasound were also utilized to evaluate blood flow to the testicles.  COMPARISON:  None.  FINDINGS: Right testicle  Measurements: 4.0 x 2.7 x 2.4 cm. No mass or microlithiasis visualized.  Left testicle  Measurements: 3.8 x 2.3 x 2.6 cm. No mass or microlithiasis visualized.  Right epididymis: Small epididymal cyst/spermatocele is 0.9 x 0.8 x 0.7 cm. The epididymal head is mildly prominent without suspicious mass. No associated hypervascularity.  Left epididymis:  Normal in size and appearance.  Hydrocele:  Small right hydrocele present.  Varicocele:  Bilateral varicoceles are noted.  Pulsed Doppler interrogation of both testes demonstrates normal low resistance arterial and venous waveforms bilaterally.  Additional findings:  There is marked skin thickening of the scrotum, left greater than right. Marked edema is identified without evidence for abscess.  IMPRESSION: 1. No evidence for intra-testicular mass or torsion. 2. Small right epididymal cyst/spermatocele. 3. Significant hyperemia and edema of the scrotum, left greater than right. No definite abscess identified.   Electronically Signed   By: Norva PavlovElizabeth  Brown M.D.   On:  11/12/2014 21:18   [place] Art/ven Flow Abd Pelv Doppler  11/12/2014   CLINICAL DATA:  Bilateral testicle pain. Pain with changing positions. Red and swollen testicles bilaterally. History of diabetes and hypertension.  EXAM: SCROTAL ULTRASOUND  DOPPLER ULTRASOUND OF THE TESTICLES  TECHNIQUE: Complete ultrasound examination of the testicles, epididymis, and other scrotal structures was performed. Color and spectral Doppler ultrasound were also utilized to evaluate blood flow to the testicles.  COMPARISON:  None.   FINDINGS: Right testicle  Measurements: 4.0 x 2.7 x 2.4 cm. No mass or microlithiasis visualized.  Left testicle  Measurements: 3.8 x 2.3 x 2.6 cm. No mass or microlithiasis visualized.  Right epididymis: Small epididymal cyst/spermatocele is 0.9 x 0.8 x 0.7 cm. The epididymal head is mildly prominent without suspicious mass. No associated hypervascularity.  Left epididymis:  Normal in size and appearance.  Hydrocele:  Small right hydrocele present.  Varicocele:  Bilateral varicoceles are noted.  Pulsed Doppler interrogation of both testes demonstrates normal low resistance arterial and venous waveforms bilaterally.  Additional findings:  There is marked skin thickening of the scrotum, left greater than right. Marked edema is identified without evidence for abscess.  IMPRESSION: 1. No evidence for intra-testicular mass or torsion. 2. Small right epididymal cyst/spermatocele. 3. Significant hyperemia and edema of the scrotum, left greater than right. No definite abscess identified.   Electronically Signed   By: Norva Pavlov M.D.   On: 11/12/2014 21:18    Scheduled Meds: . allopurinol  100 mg Oral Daily  . bumetanide  1 mg Oral BID  . finasteride  5 mg Oral Daily  . heparin  5,000 Units Subcutaneous 3 times per day  . levothyroxine  137 mcg Oral QAC breakfast  . metoprolol tartrate  12.5 mg Oral BID  . simvastatin  40 mg Oral QHS  . tamsulosin  0.4 mg Oral Daily  . vitamin B-12  1,000 mcg Oral Daily   Continuous Infusions:   Principal Problem:   CKD (chronic kidney disease) stage 4, GFR 15-29 ml/min Active Problems:   Hypertension   DM (diabetes mellitus)   Atrial fibrillation   Peripheral edema    Time spent: 35 minutes    Jeralyn Bennett  [facility] Pager [phone]. If 7PM-7AM, please contact night-coverage at [email or web address], password [facility] 11/13/2014, 5:01 PM

## 2014-11-13 NOTE — Progress Notes (Signed)
UR completed 

## 2014-11-14 DIAGNOSIS — R601 Generalized edema: Secondary | ICD-10-CM | POA: Diagnosis not present

## 2014-11-14 DIAGNOSIS — R609 Edema, unspecified: Secondary | ICD-10-CM | POA: Diagnosis present

## 2014-11-14 DIAGNOSIS — I5033 Acute on chronic diastolic (congestive) heart failure: Secondary | ICD-10-CM

## 2014-11-14 DIAGNOSIS — E039 Hypothyroidism, unspecified: Secondary | ICD-10-CM | POA: Diagnosis present

## 2014-11-14 DIAGNOSIS — I129 Hypertensive chronic kidney disease with stage 1 through stage 4 chronic kidney disease, or unspecified chronic kidney disease: Secondary | ICD-10-CM | POA: Diagnosis present

## 2014-11-14 DIAGNOSIS — I5023 Acute on chronic systolic (congestive) heart failure: Secondary | ICD-10-CM | POA: Diagnosis not present

## 2014-11-14 DIAGNOSIS — I509 Heart failure, unspecified: Secondary | ICD-10-CM | POA: Diagnosis not present

## 2014-11-14 DIAGNOSIS — E785 Hyperlipidemia, unspecified: Secondary | ICD-10-CM | POA: Diagnosis present

## 2014-11-14 DIAGNOSIS — I272 Other secondary pulmonary hypertension: Secondary | ICD-10-CM | POA: Diagnosis present

## 2014-11-14 DIAGNOSIS — D631 Anemia in chronic kidney disease: Secondary | ICD-10-CM | POA: Diagnosis present

## 2014-11-14 DIAGNOSIS — I482 Chronic atrial fibrillation: Secondary | ICD-10-CM | POA: Diagnosis not present

## 2014-11-14 DIAGNOSIS — I2510 Atherosclerotic heart disease of native coronary artery without angina pectoris: Secondary | ICD-10-CM | POA: Diagnosis present

## 2014-11-14 DIAGNOSIS — N184 Chronic kidney disease, stage 4 (severe): Secondary | ICD-10-CM | POA: Diagnosis not present

## 2014-11-14 DIAGNOSIS — I69951 Hemiplegia and hemiparesis following unspecified cerebrovascular disease affecting right dominant side: Secondary | ICD-10-CM | POA: Diagnosis not present

## 2014-11-14 DIAGNOSIS — D696 Thrombocytopenia, unspecified: Secondary | ICD-10-CM | POA: Diagnosis present

## 2014-11-14 DIAGNOSIS — Z79899 Other long term (current) drug therapy: Secondary | ICD-10-CM | POA: Diagnosis not present

## 2014-11-14 DIAGNOSIS — Z951 Presence of aortocoronary bypass graft: Secondary | ICD-10-CM | POA: Diagnosis not present

## 2014-11-14 DIAGNOSIS — E860 Dehydration: Secondary | ICD-10-CM | POA: Diagnosis present

## 2014-11-14 DIAGNOSIS — E119 Type 2 diabetes mellitus without complications: Secondary | ICD-10-CM | POA: Diagnosis present

## 2014-11-14 LAB — BASIC METABOLIC PANEL
ANION GAP: 9 (ref 5–15)
BUN: 79 mg/dL — ABNORMAL HIGH (ref 6–23)
CALCIUM: 8.5 mg/dL (ref 8.4–10.5)
CO2: 18 mmol/L — ABNORMAL LOW (ref 19–32)
Chloride: 113 mmol/L — ABNORMAL HIGH (ref 96–112)
Creatinine, Ser: 2.65 mg/dL — ABNORMAL HIGH (ref 0.50–1.35)
GFR calc Af Amer: 23 mL/min — ABNORMAL LOW (ref >90)
GFR calc non Af Amer: 20 mL/min — ABNORMAL LOW (ref >90)
Glucose, Bld: 95 mg/dL (ref 70–99)
Potassium: 4.7 mmol/L (ref 3.5–5.1)
SODIUM: 140 mmol/L (ref 135–145)

## 2014-11-14 LAB — CBC
HEMATOCRIT: 25.9 % — AB (ref 39.0–52.0)
Hemoglobin: 8.3 g/dL — ABNORMAL LOW (ref 13.0–17.0)
MCH: 30.5 pg (ref 26.0–34.0)
MCHC: 32 g/dL (ref 30.0–36.0)
MCV: 95.2 fL (ref 78.0–100.0)
Platelets: 139 10*3/uL — ABNORMAL LOW (ref 150–400)
RBC: 2.72 MIL/uL — AB (ref 4.22–5.81)
RDW: 16.2 % — ABNORMAL HIGH (ref 11.5–15.5)
WBC: 4.4 10*3/uL (ref 4.0–10.5)

## 2014-11-14 LAB — GLUCOSE, CAPILLARY
GLUCOSE-CAPILLARY: 108 mg/dL — AB (ref 70–99)
Glucose-Capillary: 133 mg/dL — ABNORMAL HIGH (ref 70–99)

## 2014-11-14 MED ORDER — FUROSEMIDE 10 MG/ML IJ SOLN
40.0000 mg | Freq: Two times a day (BID) | INTRAMUSCULAR | Status: DC
  Administered 2014-11-14 (×2): 40 mg via INTRAVENOUS
  Filled 2014-11-14 (×4): qty 4

## 2014-11-14 MED ORDER — ACETAMINOPHEN 325 MG PO TABS: 650.0000 mg | ORAL_TABLET | Freq: Four times a day (QID) | ORAL | Status: DC | PRN

## 2014-11-14 MED ORDER — ALBUTEROL SULFATE (2.5 MG/3ML) 0.083% IN NEBU: 2.5000 mg | INHALATION_SOLUTION | RESPIRATORY_TRACT | Status: DC | PRN

## 2014-11-14 MED ORDER — METOPROLOL TARTRATE 12.5 MG HALF TABLET
12.5000 mg | ORAL_TABLET | Freq: Two times a day (BID) | ORAL | Status: DC
  Administered 2014-11-14 – 2014-11-16 (×4): 12.5 mg via ORAL
  Filled 2014-11-14 (×5): qty 1

## 2014-11-14 NOTE — Progress Notes (Addendum)
PROGRESS NOTE    Eric Bowers WJX:[id number] DOB: Jan 03, 1924 DOA: 11/12/2014 PCP: Tandy Gaw, PA-C  HPI/Brief narrative 81 year old male with history of CKD 4, DM 2, CAD, A. fib, HLD, essential hypertension, hypothyroid, hypothyroid, recent hospitalization at [facility] where creatinine 2.3 and BUN 58 on discharge to SNF. Patient received approximately 2 L of IV fluids at SNF for reported dehydration. Patient admitted to [facility] on 11/12/14 for worsening lower extremity and scrotal edema.  Assessment/Plan:  Principal Problem:   Anasarca - Likely multifactorial: Stage IV chronic kidney disease and acute on chronic diastolic (presumed) CHF - Increase IV Lasix to 40 mg every 12 hours. Diuresing. - Hold oral Bumex temporarily while on IV Lasix. - Monitor daily weights and strict intake and output.  Active Problems:   Atrial fibrillation - Controlled ventricular rate. - Beta blockers were held secondary to soft blood pressures. We'll resume with holding parameters. - CHA2DS2-VASc Score: at least 7. High risk for stroke but not on anticoagulation PTA-? Fall risk    CKD (chronic kidney disease) stage 4, GFR 15-29 ml/min - Creatinine has remained in 2.6 range since admission. - Baseline creatinine not known. Last recorded creatinine in system: 1.69 on 09/07/11 - Outpatient follow-up with nephrology. - Not HD candidate    Diastolic CHF, acute on chronic - IV Lasix as above and monitor. - No 2-D echo in system. Ordered. - Follow chest x-ray.    Hyperlipidemia - Continue statins    Hypertension - Metoprolol was temporarily held secondary to soft blood pressures. We'll resume with holding parameters.    Hypothyroidism - Continue Synthroid    DM (diabetes mellitus) - Monitor CBGs   Anemia & thrombocytopenia - May be chronic. Follow CBCs    Code Status: Full Family Communication: None at bedside Disposition Plan: DC to SNF when medically  stable   Consultants:  None  Procedures:  None  Antibiotics:  None   Subjective: Continues to complain of lower extremity and scrotal edema without pain. Complains of intermittent dyspnea but no chest pain or cough.  Objective: Filed Vitals:   11/13/14 1359 11/13/14 2122 11/14/14 0617 11/14/14 1406  BP: 112/65 108/58 104/53 110/59  Pulse: 75 84  79  Temp: 98.1 F (36.7 C) 98.2 F (36.8 C) 98.7 F (37.1 C) 98.2 F (36.8 C)  TempSrc: Oral Oral Oral Oral  Resp: SpO2: 97% 98% 95% 97%    Intake/Output Summary (Last 24 hours) at 11/14/14 1534 Last data filed at 11/14/14 0616  Gross per 24 hour  Intake      0 ml  Output   1350 ml  Net  -1350 ml   There were no vitals filed for this visit.   Exam:  General exam: Pleasant elderly male lying comfortably propped up in bed. Respiratory system: Diminished breath sounds in the bases with occasional basal crackles. Rest of lung fields clear to auscultation. No increased work of breathing. Cardiovascular system: S1 & S2 heard, RRR. No JVD, murmurs, gallops, clicks. 2+ pitting/chronic lower extremity edema extending to scrotal edema (no acute signs suggestive of infection) and sacral edema. Gastrointestinal system: Abdomen is nondistended, soft and nontender. Normal bowel sounds heard. Central nervous system: Alert and oriented. No focal neurological deficits. Extremities: 5 x 5 power in left extremities. Grade 0 x 5 power in right limbs.   Data Reviewed: Basic Metabolic Panel:  Recent Labs Lab 11/12/14 1628 11/13/14 0505 11/14/14 0524  NA 139 140 140  K  4.8 4.5 4.7  CL 114* 115* 113*  CO2 16* 17* 18*  GLUCOSE 146* 117* 95  BUN 79* 80* 79*  CREATININE 2.69* 2.66* 2.65*  CALCIUM 8.4 8.3* 8.5   Liver Function Tests:  Recent Labs Lab 11/12/14 1628  AST 16  ALT 13  ALKPHOS 148*  BILITOT 0.6  PROT 6.3  ALBUMIN 3.1*   No results for input(s): LIPASE, AMYLASE in the last 168 hours. No results  for input(s): AMMONIA in the last 168 hours. CBC:  Recent Labs Lab 11/12/14 1628 11/14/14 0524  WBC 4.5 4.4  NEUTROABS 2.5  --   HGB 8.5* 8.3*  HCT 26.6* 25.9*  MCV 95.3 95.2  PLT 131* 139*   Cardiac Enzymes:  Recent Labs Lab 11/12/14 1628  TROPONINI <0.03   BNP (last 3 results) No results for input(s): PROBNP in the last 8760 hours. CBG: No results for input(s): GLUCAP in the last 168 hours.  No results found for this or any previous visit (from the past 240 hour(s)).       Studies: Dg Chest 2 View  11/12/2014   CLINICAL DATA:  Shortness of breath. Cough and congestion. Wheezing for 2 days.  EXAM: CHEST  2 VIEW  COMPARISON:  01/20/2004  FINDINGS: Mild enlargement of the cardiopericardial silhouette with atherosclerotic calcification of the thoracic aorta. Prior CABG.  Small bilateral pleural effusions. Tortuous descending thoracic aorta.  Density projecting over the cardiac shadow likely represents combination of tortuous aorta pleural effusion. Hiatal hernia could also appear this way but is not readily seen on the lateral projection.  Chronic fracture of the right proximal humeral shaft.  IMPRESSION: 1. Cardiomegaly with small bilateral pleural effusions. 2. Tortuous thoracic aorta. Effusion and aorta likely accounts for the retrocardiac density although a hiatal hernia could also have this appearance. 3. Chronic fracture, right proximal humerus.   Electronically Signed   By: Gaylyn Rong M.D.   On: 11/12/2014 17:31   US Abdomen Complete  11/13/2014   CLINICAL DATA:  Abdominal pain  EXAM: ULTRASOUND ABDOMEN COMPLETE  COMPARISON:  Abdominal CT 08/28/2005  FINDINGS: Gallbladder: Partly shadowing echogenic structure most consistent with stone. No wall thickening or focal tenderness.  Common bile duct: Diameter: Limited visualization, with no evidence of dilatation.  Liver: In homogeneous liver without focal mass lesion or clear cirrhotic lobulation. There is antegrade flow in  the imaged portal venous system.  IVC: No abnormality visualized.  Pancreas: Visualized portion unremarkable.  Spleen: Size and appearance within normal limits.  Right Kidney: Length: 11 cm.  No mass or hydronephrosis visualized.  Left Kidney: Length: 8 cm. There is limited visualization due to narrow sonographic windows. No evidence of hydronephrosis.  Abdominal aorta: Only the proximal portion is visible, normal in diameter 2.5 cm.  Other findings: Small ascites noted around the liver. Small right pleural effusion.  IMPRESSION: 1. Technically challenging and limited study.  No acute findings. 2. Cholelithiasis without acute cholecystitis. 3. Left renal atrophy.   Electronically Signed   By: Marnee Spring M.D.   On: 11/13/2014 00:23   US Scrotum  11/12/2014   CLINICAL DATA:  Bilateral testicle pain. Pain with changing positions. Red and swollen testicles bilaterally. History of diabetes and hypertension.  EXAM: SCROTAL ULTRASOUND  DOPPLER ULTRASOUND OF THE TESTICLES  TECHNIQUE: Complete ultrasound examination of the testicles, epididymis, and other scrotal structures was performed. Color and spectral Doppler ultrasound were also utilized to evaluate blood flow to the testicles.  COMPARISON:  None.  FINDINGS: Right testicle  Measurements: 4.0 x 2.7 x 2.4 cm. No mass or microlithiasis visualized.  Left testicle  Measurements: 3.8 x 2.3 x 2.6 cm. No mass or microlithiasis visualized.  Right epididymis: Small epididymal cyst/spermatocele is 0.9 x 0.8 x 0.7 cm. The epididymal head is mildly prominent without suspicious mass. No associated hypervascularity.  Left epididymis:  Normal in size and appearance.  Hydrocele:  Small right hydrocele present.  Varicocele:  Bilateral varicoceles are noted.  Pulsed Doppler interrogation of both testes demonstrates normal low resistance arterial and venous waveforms bilaterally.  Additional findings:  There is marked skin thickening of the scrotum, left greater than right. Marked  edema is identified without evidence for abscess.  IMPRESSION: 1. No evidence for intra-testicular mass or torsion. 2. Small right epididymal cyst/spermatocele. 3. Significant hyperemia and edema of the scrotum, left greater than right. No definite abscess identified.   Electronically Signed   By: Elizabeth  Brown M.D.   On: 11/12/2014 21:18   [place] Art/ven Flow Abd PelNorva Pavlovv Doppler  11/12/2014   CLINICAL DATA:  Bilateral testicle pain. Pain with changing positions. Red and swollen testicles bilaterally. History of diabetes and hypertension.  EXAM: SCROTAL ULTRASOUND  DOPPLER ULTRASOUND OF THE TESTICLES  TECHNIQUE: Complete ultrasound examination of the testicles, epididymis, and other scrotal structures was performed. Color and spectral Doppler ultrasound were also utilized to evaluate blood flow to the testicles.  COMPARISON:  None.  FINDINGS: Right testicle  Measurements: 4.0 x 2.7 x 2.4 cm. No mass or microlithiasis visualized.  Left testicle  Measurements: 3.8 x 2.3 x 2.6 cm. No mass or microlithiasis visualized.  Right epididymis: Small epididymal cyst/spermatocele is 0.9 x 0.8 x 0.7 cm. The epididymal head is mildly prominent without suspicious mass. No associated hypervascularity.  Left epididymis:  Normal in size and appearance.  Hydrocele:  Small right hydrocele present.  Varicocele:  Bilateral varicoceles are noted.  Pulsed Doppler interrogation of both testes demonstrates normal low resistance arterial and venous waveforms bilaterally.  Additional findings:  There is marked skin thickening of the scrotum, left greater than right. Marked edema is identified without evidence for abscess.  IMPRESSION: 1. No evidence for intra-testicular mass or torsion. 2. Small right epididymal cyst/spermatocele. 3. Significant hyperemia and edema of the scrotum, left greater than right. No definite abscess identified.   Electronically Signed   By: Norva PavlovElizabeth  Brown M.D.   On: 11/12/2014 21:18        Scheduled Meds: .  allopurinol  100 mg Oral Daily  . finasteride  5 mg Oral Daily  . furosemide  40 mg Intravenous BID  . heparin  5,000 Units Subcutaneous 3 times per day  . levothyroxine  137 mcg Oral QAC breakfast  . simvastatin  40 mg Oral QHS  . tamsulosin  0.4 mg Oral Daily  . vitamin B-12  1,000 mcg Oral Daily   Continuous Infusions:   Principal Problem:   CKD (chronic kidney disease) stage 4, GFR 15-29 ml/min Active Problems:   Hypertension   DM (diabetes mellitus)   Atrial fibrillation   Peripheral edema    Time spent: 50 minutes.    Marcellus ScottHONGALGI,Jerrett Baldinger, MD, FACP, FHM. [facility] Pager [phone]  If 7PM-7AM, please contact night-coverage [email or web address] Password [email or web address] 11/14/2014, 3:34 PM

## 2014-11-14 NOTE — Progress Notes (Signed)
Echocardiogram 2D Echocardiogram has been performed.  Eric Bowers, Eric Bowers 11/14/2014, 2:53 PM

## 2014-11-14 NOTE — Progress Notes (Signed)
Clinical Social Work Department BRIEF PSYCHOSOCIAL ASSESSMENT 11/14/2014  Patient:  Eric Bowers, Eric Bowers     Account Number:  [id number]     Admit date:  11/12/2014  Clinical Social Worker:  Peri Maris, [place]  Date/Time:  11/14/2014 11:23 AM  Referred by:  Physician  Date Referred:  11/13/2014 Referred for  SNF Placement   Other Referral:   Interview type:  Patient Other interview type:    PSYCHOSOCIAL DATA Living Status:  FACILITY Admitted from facility:  [place], [facility] Level of care:  [place] Primary support name:  Drue Novel Primary support relationship to patient:  FAMILY Degree of support available:   Adequate    CURRENT CONCERNS Current Concerns  Post-Acute Placement   Other Concerns:   Pt reports being concerned about how much of the hospital stays that the insurance company will cover.    SOCIAL WORK ASSESSMENT / PLAN CSW met with Pt to discuss discharge planning and assess for psychosocial needs. Pt was admitted to the hospital from [facility]. Prior to this he was admitted to [facility] from independent living with his wife. CSW has also contacted family and Pt facility to assess for ability to return for continued rehab. [facility] is agreeable to accepting Pt back once ready for discharge. CSW to continue following Pt to assist with discharge planning while hospitalized.   Assessment/plan status:  Psychosocial Support/Ongoing Assessment of Needs Other assessment/ plan:   N/A   Information/referral to community resources:   N/A    PATIENT'S/FAMILY'S RESPONSE TO PLAN OF CARE: Pt is hearing impaired in both ears so he had difficulty hearing CSW but was cooperative and pleasant throughout assessment. Pt reported that he was ready to be discharged and go back to rehab in order to be able to return to his residence with his wife. CSW and Pt discussed his support system including his wife and  granddaughter. Pt reported being concerned with cost of hospitalization. He and family are agreeable to returning to [facility] for continued rehab. Family and Pt have been appreciative of services and are satisfied with discharge plans.     Peri Maris, [place] 11/14/2014 11:32 AM [phone]

## 2014-11-14 NOTE — Progress Notes (Signed)
UR completed 

## 2014-11-15 ENCOUNTER — Encounter (HOSPITAL_COMMUNITY): Payer: Self-pay | Admitting: Cardiology

## 2014-11-15 ENCOUNTER — Inpatient Hospital Stay (HOSPITAL_COMMUNITY): Payer: Medicare Other

## 2014-11-15 DIAGNOSIS — I5023 Acute on chronic systolic (congestive) heart failure: Secondary | ICD-10-CM

## 2014-11-15 LAB — BASIC METABOLIC PANEL
Anion gap: 11 (ref 5–15)
BUN: 86 mg/dL — AB (ref 6–23)
CALCIUM: 8.4 mg/dL (ref 8.4–10.5)
CHLORIDE: 110 mmol/L (ref 96–112)
CO2: 19 mmol/L (ref 19–32)
CREATININE: 2.7 mg/dL — AB (ref 0.50–1.35)
GFR, EST AFRICAN AMERICAN: 23 mL/min — AB (ref >90)
GFR, EST NON AFRICAN AMERICAN: 19 mL/min — AB (ref >90)
Glucose, Bld: 112 mg/dL — ABNORMAL HIGH (ref 70–99)
Potassium: 4.4 mmol/L (ref 3.5–5.1)
Sodium: 140 mmol/L (ref 135–145)

## 2014-11-15 LAB — GLUCOSE, CAPILLARY
GLUCOSE-CAPILLARY: 105 mg/dL — AB (ref 70–99)
Glucose-Capillary: 128 mg/dL — ABNORMAL HIGH (ref 70–99)
Glucose-Capillary: 126 mg/dL — ABNORMAL HIGH (ref 70–99)
Glucose-Capillary: 142 mg/dL — ABNORMAL HIGH (ref 70–99)

## 2014-11-15 LAB — CBC
HCT: 25.1 % — ABNORMAL LOW (ref 39.0–52.0)
Hemoglobin: 8.2 g/dL — ABNORMAL LOW (ref 13.0–17.0)
MCH: 30.8 pg (ref 26.0–34.0)
MCHC: 32.7 g/dL (ref 30.0–36.0)
MCV: 94.4 fL (ref 78.0–100.0)
Platelets: 135 10*3/uL — ABNORMAL LOW (ref 150–400)
RBC: 2.66 MIL/uL — ABNORMAL LOW (ref 4.22–5.81)
RDW: 16.1 % — AB (ref 11.5–15.5)
WBC: 4.4 10*3/uL (ref 4.0–10.5)

## 2014-11-15 MED ORDER — FUROSEMIDE 10 MG/ML IJ SOLN
20.0000 mg | Freq: Two times a day (BID) | INTRAMUSCULAR | Status: DC
  Administered 2014-11-15 – 2014-11-16 (×3): 20 mg via INTRAVENOUS
  Filled 2014-11-15 (×3): qty 2

## 2014-11-15 NOTE — Consult Note (Signed)
CARDIOLOGY CONSULT NOTE     Patient ID: Eric Bowers MRN: [id number] DOB/AGE: 79/11/1923 79 y.o.  Admit date: 11/12/2014 Referring Physician Archie BalboaAnand Hongali MD Primary Physician Tandy GawBREEBACK, JADE, PA-C Primary Cardiologist N/A  Reason for Consultation CHF  HPI:  81 yo WM seen at the request of the hospitalist service for evaluation of CHF. He has a known history of CAD and is s/p CABG in 1994. He has chronic atrial fibrillation. He was formerly on coumadin but is not on anticoagulation now. He has a history of right heart failure with severe TR and pulmonary HTN. He has not been seen by cardiology here in many years. [facility] over one year [facility]. His primary care is Dr. Salvadore Farberorrington. [facility] He was DC to Rehab. He reports that he was felt to be "dehydrated" and so was given 2 liters of fluid with result of increased edema including scrotal and abdominal swelling and increased SOB. He denies any chest pain or palpitations. He does not know why he is no longer on coumadin. He states it made him feel bad but he denies any bleeding. He then states it all worked out because he quit drinking sodas. He does have CKD stage 4.   Past Medical History  Diagnosis Date  . Cough   . Diabetes mellitus   . CAD (coronary artery disease)   . Atrial fibrillation   . Pleural effusion   . Hyperlipidemia   . Hypertension   . Thyroid disease   . Heart disease 09/07/11  . Chronic right-sided heart failure     Family History  Problem Relation Age of Onset  . Heart attack Father     History   Social History  . Marital Status: Married    Spouse Name: N/A  . Number of Children: N/A  . Years of Education: N/A   Occupational History  . Not on file.   Social History Main Topics  . Smoking status: Never Smoker   . Smokeless tobacco: Not on file  . Alcohol Use: Not on file  . Drug Use: Not on file  . Sexual Activity: Not on file   Other Topics  Concern  . Not on file   Social History Narrative    Past Surgical History  Procedure Laterality Date  . Coronary artery bypass graft  1994     Prescriptions prior to admission  Medication Sig Dispense Refill Last Dose  . allopurinol (ZYLOPRIM) 100 MG tablet Take 100 mg by mouth daily.  3 11/12/2014 at 0900  . colchicine 0.6 MG tablet Take 0.6 mg by mouth as needed (gout).    unknown  . finasteride (PROSCAR) 5 MG tablet Take 5 mg by mouth daily.   11/12/2014 at 0900  . furosemide (LASIX) 40 MG tablet Take 20 mg by mouth daily as needed for fluid or edema.    11/12/2014  . levothyroxine (SYNTHROID, LEVOTHROID) 137 MCG tablet TAKE 1 TABLET (137 MCG TOTAL) BY MOUTH 1 DAY OR 1 DOSE. (Patient taking differently: TAKE 1 TABLET BY MOUTH DAILY) 30 tablet 3 11/11/2014 at Unknown time  . metoprolol tartrate (LOPRESSOR) 25 MG tablet Take 12.5 mg by mouth 2 (two) times daily.   11/12/2014 at 0900  . simvastatin (ZOCOR) 40 MG tablet Take 40 mg by mouth at bedtime.     11/11/2014 at Unknown time  . Tamsulosin HCl (FLOMAX) 0.4 MG CAPS Take 0.4 mg by mouth daily.  11/11/2014 at Unknown time  . vitamin B-12 (CYANOCOBALAMIN) 1000 MCG tablet Take 1,000 mcg by mouth daily.   11/12/2014 at 0900     ROS: As noted in HPI. All other systems are reviewed and are negative unless otherwise mentioned.   Physical Exam: Blood pressure 104/52, pulse 76, temperature 98.2 F (36.8 C), temperature source Oral, resp. rate 20, SpO2 95 %.  Current Weight  10/05/11 198 lb (89.812 kg)  09/07/11 189 lb (85.73 kg)    GENERAL:  Chronically ill appearing elderly WM in NAD HEENT:  PERRL, EOMI, sclera are clear. Oropharynx is clear. NECK:  Positive  jugular venous distention to 8 cm, carotid upstroke brisk and symmetric, no bruits, no thyromegaly or adenopathy LUNGS:  Clear to auscultation bilaterally CHEST:  Unremarkable HEART:  RRR,  PMI not displaced or sustained,S1 and S2 within normal limits, no S3, no S4: no clicks, no rubs, no  murmurs ABD:  Soft, nontender. BS +, no masses or bruits. No hepatomegaly, no splenomegaly. Mildly distended. + scrotal edema.  EXT:  2 + pulses throughout, 3+ edema SKIN:  Warm and dry.  No rashes NEURO:  Alert and oriented x 3. Cranial nerves II through XII intact. PSYCH:  Cognitively intact    Labs:   Lab Results  Component Value Date   WBC 4.4 11/15/2014   HGB 8.2* 11/15/2014   HCT 25.1* 11/15/2014   MCV 94.4 11/15/2014   PLT 135* 11/15/2014    Recent Labs Lab 11/12/14 1628  11/15/14 0525  NA 139  < > 140  K 4.8  < > 4.4  CL 114*  < > 110  CO2 16*  < > 19  BUN 79*  < > 86*  CREATININE 2.69*  < > 2.70*  CALCIUM 8.4  < > 8.4  PROT 6.3  --   --   BILITOT 0.6  --   --   ALKPHOS 148*  --   --   ALT 13  --   --   AST 16  --   --   GLUCOSE 146*  < > 112*  < > = values in this interval not displayed. Lab Results  Component Value Date   TROPONINI <0.03 11/12/2014    Lab Results  Component Value Date   CHOL 90 09/07/2011   Lab Results  Component Value Date   HDL 30* 09/07/2011   Lab Results  Component Value Date   LDLCALC 47 09/07/2011   Lab Results  Component Value Date   TRIG 67 09/07/2011   Lab Results  Component Value Date   CHOLHDL 3.0 09/07/2011   No results found for: LDLDIRECT  No results found for: PROBNP Lab Results  Component Value Date   TSH 8.334* 09/07/2011   Lab Results  Component Value Date   HGBA1C 7.1 09/07/2011    Radiology: Dg Chest 2 View  11/15/2014   CLINICAL DATA:  Subsequent encounter for weakness and shortness of breath, re-evaluate congestive heart failure  EXAM: CHEST  2 VIEW  COMPARISON:  11/12/2014  FINDINGS: Moderate cardiac enlargement status post CABG. Aortic arch calcification. Stable vascular congestion. Increased peribronchial wall thickening. No definite interstitial edema. Significantly increased hazy opacity overlying the inferior half of the right lung. On the lateral view this appears to be related to a  pleural effusion with underlying consolidation.  IMPRESSION: Cardiac enlargement with vascular congestion and increased peribronchial cuffing equivocal for minimal interstitial edema.  Increased opacity right mid to lower lung zone consisting of pleural  effusion and underlying consolidation.   Electronically Signed   By: Esperanza Heir M.D.   On: 11/15/2014 09:19    EKG: Afib with controlled rate. Low voltage QRS. Nonspecific ST-t changes. I have personally reviewed and interpreted this study.  Echo:Study Conclusions  - Left ventricle: The cavity size was normal. Wall thickness was normal. Systolic function was normal. The estimated ejection fraction was in the range of 50% to 55%. Regional wall motion abnormalities cannot be excluded. - Mitral valve: There was mild regurgitation. - Left atrium: The atrium was mildly dilated. - Right atrium: The atrium was mildly dilated. - Tricuspid valve: There was severe regurgitation. - Pulmonary arteries: PA peak pressure: 40 mm Hg (S).  Impressions:  - No cardiac source of emboli was indentified.   ASSESSMENT AND PLAN:  1. Acute on chronic right heart failure with severe TR. Exacerbated by CKD. Agree with diuretic therapy. He is not a candidate for ACEi/ARB/aldactone due to CKD. Beta blocker held due to bradycardia and low BP. (he is not currently on telemetry but HR in 70s. Would continue lasix until close to his baseline weight.  2. CAD s/p remote CABG 1994. No active angina. 3. Atrial fibrillation. CHAD-Vasc score is 7 and he is at high risk for CVA. I would recommend resuming coumadin unless there is a clear contraindication. ( I don't think fall risk qualifies). He is not a candidate for a NOAC due to advanced renal disease. You may want to touch base with his primary care to see if there was a reason coumadin was stopped.  4. DM 5. History of CVA 6. HTN 7. CKD stage 4. 8. Anemia of CKD.   Signed: Tanav Orsak [place], MDFACC  11/15/2014,  10:58 AM

## 2014-11-15 NOTE — Progress Notes (Deleted)
PROGRESS NOTE    Eric Bowers [id number] DOB: 08/20/1923 DOA: 11/12/2014 PCP: Vivien PrestoORRINGTON,KIP A, MD  HPI/Brief narrative 81 year old male with history of CKD 4, DM 2, CAD, A. fib, HLD, essential hypertension, hypothyroid, hypothyroid, recent hospitalization at [facility] where creatinine 2.3 and BUN 58 on discharge to SNF. Patient received approximately 2 L of IV fluids at SNF for reported dehydration. [facility] 11/12/14 for worsening lower extremity and scrotal edema.  Assessment/Plan:  Principal Problem:   Anasarca - Likely multifactorial: Stage IV chronic kidney disease and acute on chronic diastolic (presumed) CHF - Increase IV Lasix to 40 mg every 12 hours. Diuresing. - Hold oral Bumex temporarily while on IV Lasix. - Monitor daily weights and strict intake and output.  Active Problems:   Atrial fibrillation - Controlled ventricular rate. - Beta blockers were held secondary to soft blood pressures. We'll resume with holding parameters. - CHA2DS2-VASc Score: at least 7. High risk for stroke but not on anticoagulation PTA-? Fall risk    CKD (chronic kidney disease) stage 4, GFR 15-29 ml/min - Creatinine has remained in 2.6 range since admission. - Baseline creatinine not known. Last recorded creatinine in system: 1.69 on 09/07/11 - Outpatient follow-up with nephrology. - Not HD candidate    Diastolic CHF, acute on chronic - IV Lasix as above and monitor. - No 2-D echo in system. Ordered. - Follow chest x-ray.    Hyperlipidemia - Continue statins    Hypertension - Metoprolol was temporarily held secondary to soft blood pressures. We'll resume with holding parameters.    Hypothyroidism - Continue Synthroid    DM (diabetes mellitus) - Monitor CBGs   Anemia & thrombocytopenia - May be chronic. Follow CBCs    Code Status: Full Family Communication: None at bedside Disposition Plan: DC to SNF when medically  stable   Consultants:  None  Procedures:  None  Antibiotics:  None   Subjective: Continues to complain of lower extremity and scrotal edema without pain. Complains of intermittent dyspnea but no chest pain or cough.  Objective: Filed Vitals:   11/14/14 0617 11/14/14 1406 11/14/14 2132 11/15/14 0502  BP: 104/53 110/59 108/61 104/52  Pulse:  79 74 76  Temp: 98.7 F (37.1 C) 98.2 F (36.8 C) 97.7 F (36.5 C) 98.2 F (36.8 C)  TempSrc: Oral Oral Oral Oral  Resp: 18 20 20 20   SpO2: 95% 97% 98% 95%    Intake/Output Summary (Last 24 hours) at 11/15/14 1346 Last data filed at 11/15/14 1247  Gross per 24 hour  Intake    960 ml  Output   3525 ml  Net  -2565 ml   There were no vitals filed for this visit.   Exam:  General exam: Pleasant elderly male lying comfortably propped up in bed. Respiratory system: Diminished breath sounds in the bases with occasional basal crackles. Rest of lung fields clear to auscultation. No increased work of breathing. Cardiovascular system: S1 & S2 heard, RRR. No JVD, murmurs, gallops, clicks. 2+ pitting/chronic lower extremity edema extending to scrotal edema (no acute signs suggestive of infection) and sacral edema. Gastrointestinal system: Abdomen is nondistended, soft and nontender. Normal bowel sounds heard. Central nervous system: Alert and oriented. No focal neurological deficits. Extremities: Symmetric 5 x 5 power in left extremities. Grade 0 x 5 power in right limbs.   Data Reviewed: Basic Metabolic Panel:  Recent Labs Lab 11/12/14 1628 11/13/14 0505 11/14/14 0524 11/15/14 0525  NA 139 140 140 140  K 4.8 4.5 4.7 4.4  CL 114* 115* 113* 110  CO2 16* 17* 18* 19  GLUCOSE 146* 117* 95 112*  BUN 79* 80* 79* 86*  CREATININE 2.69* 2.66* 2.65* 2.70*  CALCIUM 8.4 8.3* 8.5 8.4   Liver Function Tests:  Recent Labs Lab 11/12/14 1628  AST 16  ALT 13  ALKPHOS 148*  BILITOT 0.6  PROT 6.3  ALBUMIN 3.1*   No results for  input(s): LIPASE, AMYLASE in the last 168 hours. No results for input(s): AMMONIA in the last 168 hours. CBC:  Recent Labs Lab 11/12/14 1628 11/14/14 0524 11/15/14 0525  WBC 4.5 4.4 4.4  NEUTROABS 2.5  --   --   HGB 8.5* 8.3* 8.2*  HCT 26.6* 25.9* 25.1*  MCV 95.3 95.2 94.4  PLT 131* 139* 135*   Cardiac Enzymes:  Recent Labs Lab 11/12/14 1628  TROPONINI <0.03   BNP (last 3 results) No results for input(s): PROBNP in the last 8760 hours. CBG:  Recent Labs Lab 11/14/14 1804 11/14/14 2135 11/15/14 0728 11/15/14 1141  GLUCAP 108* 133* 105* 128*    No results found for this or any previous visit (from the past 240 hour(s)).       Studies: Dg Chest 2 View  11/15/2014   CLINICAL DATA:  Subsequent encounter for weakness and shortness of breath, re-evaluate congestive heart failure  EXAM: CHEST  2 VIEW  COMPARISON:  11/12/2014  FINDINGS: Moderate cardiac enlargement status post CABG. Aortic arch calcification. Stable vascular congestion. Increased peribronchial wall thickening. No definite interstitial edema. Significantly increased hazy opacity overlying the inferior half of the right lung. On the lateral view this appears to be related to a pleural effusion with underlying consolidation.  IMPRESSION: Cardiac enlargement with vascular congestion and increased peribronchial cuffing equivocal for minimal interstitial edema.  Increased opacity right mid to lower lung zone consisting of pleural effusion and underlying consolidation.   Electronically Signed   By: Esperanza Heir M.D.   On: 11/15/2014 09:19        Scheduled Meds: . allopurinol  100 mg Oral Daily  . finasteride  5 mg Oral Daily  . furosemide  20 mg Intravenous BID  . heparin  5,000 Units Subcutaneous 3 times per day  . levothyroxine  137 mcg Oral QAC breakfast  . metoprolol tartrate  12.5 mg Oral BID  . simvastatin  40 mg Oral QHS  . tamsulosin  0.4 mg Oral Daily  . vitamin B-12  1,000 mcg Oral Daily    Continuous Infusions:   Principal Problem:   Anasarca Active Problems:   Atrial fibrillation   CKD (chronic kidney disease) stage 4, GFR 15-29 ml/min   Diastolic CHF, acute on chronic   Hyperlipidemia   Hypertension   Hypothyroidism   DM (diabetes mellitus)   Peripheral edema    Time spent: 50 minutes.    Marcellus Scott, MD, FACP, FHM. [facility] Pager [phone]  If 7PM-7AM, please contact night-coverage [email or web address] Password [email or web address] 11/15/2014, 1:46 PM    LOS: 1 day

## 2014-11-15 NOTE — Progress Notes (Addendum)
PROGRESS NOTE    [place] GuardianCharles G Roulston ZOX:[id number] DOB: 01/15/1924 DOA: 11/12/2014 PCP: Vivien PrestoORRINGTON,KIP A, MD  HPI/Brief narrative 81 year old male with history of CKD 4, DM 2, CAD, A. fib, HLD, essential hypertension, hypothyroid, recent hospitalization at [facility] where creatinine 2.3 and BUN 58 on discharge to SNF. Patient received approximately 2 L of IV fluids at SNF for reported dehydration. Patient admitted [facility] on 11/12/14 for worsening lower extremity and scrotal edema.  Assessment/Plan:  Principal Problem:   Anasarca - Multifactorial: Stage IV chronic kidney disease and acute on chronic right heart failure - Patient was diuresed aggressively with IV Lasix 40 mg BID and is -4 L since admission - Held oral Bumex temporarily while on IV Lasix. - Monitor daily weights and strict intake and output. - We will reduce Lasix to 20 mg BID - Improving.  Active Problems:   Chronic Atrial fibrillation - Controlled ventricular rate. - Beta blockers were held secondary to soft blood pressures. Resumed with holding parameters. - CHA2DS2-VASc Score: at least 7. High risk for stroke but not on anticoagulation PTA-? Fall risk - As per wife, Coumadin was discontinued 1-2 years ago ("doctor indicated that he did not need it any longer").  - Discussed with PCP on 4/7: will review records and let us know if there are any absolute contraindications for him not being on Coumadin.    CKD (chronic kidney disease) stage 4, GFR 15-29 ml/min - Creatinine has remained in 2.6 range since admission. Discussed with PCP on 4/7: baseline creatinine: 2.4 - Baseline creatinine not known. Last recorded creatinine in system: 1.69 on 09/07/11 - Outpatient follow-up with nephrology. - Not HD candidate - Monitor BMP while diuresing    Acute on chronic right heart failure with severe TR - IV Lasix as above and monitor. - Cardiology consultation appreciated. - 2-D echo results as below. -  Chest x-ray consistent with pulmonary edema. - Not candidate for ACEI/ARB/Aldactone due to chronic kidney disease.    Hyperlipidemia - Continue statins    Hypertension - Metoprolol was temporarily held secondary to soft blood pressures. Resumed with holding parameters.    Hypothyroidism - Continue Synthroid    DM (diabetes mellitus) - Monitor CBGs. Controlled   Anemia & thrombocytopenia - May be chronic. Follow CBCs   History of CVA with right hemiplegia - High stroke risk related to chronic A. Fib. - We'll discuss with PCP regarding anticoagulation with Coumadin.   Code Status: Full Family Communication: Discussed with spouse 4/7 Disposition Plan: DC to SNF when medically stable in the next 24-48 hours   Consultants:  Cardiology  Procedures:  2-D echo: 11/14/14: Study Conclusions  - Left ventricle: The cavity size was normal. Wall thickness was normal. Systolic function was normal. The estimated ejection fraction was in the range of 50% to 55%. Regional wall motion abnormalities cannot be excluded. - Mitral valve: There was mild regurgitation. - Left atrium: The atrium was mildly dilated. - Right atrium: The atrium was mildly dilated. - Tricuspid valve: There was severe regurgitation. - Pulmonary arteries: PA peak pressure: 40 mm Hg (S).  Impressions:  - No cardiac source of emboli was indentified.  Antibiotics:  None   Subjective: Indicates that lower extremity swelling and scrotal swelling have significantly improved. Denies pain. Although denies dyspnea, patient seems dyspneic on mild exertion.  Objective: Filed Vitals:   11/14/14 0617 11/14/14 1406 11/14/14 2132 11/15/14 0502  BP: 104/53 110/59 108/61 104/52  Pulse:  79 74 76  Temp:  98.7 F (37.1 C) 98.2 F (36.8 C) 97.7 F (36.5 C) 98.2 F (36.8 C)  TempSrc: Oral Oral Oral Oral  Resp: SpO2: 95% 97% 98% 95%    Intake/Output Summary (Last 24 hours) at 11/15/14 1333 Last  data filed at 11/15/14 1247  Gross per 24 hour  Intake    960 ml  Output   3525 ml  Net  -2565 ml   There were no vitals filed for this visit.   Exam:  General exam: Pleasant elderly male lying comfortably propped up in bed. Respiratory system: Diminished breath sounds in the bases with occasional basal crackles. Rest of lung fields clear to auscultation. No increased work of breathing. Cardiovascular system: S1 & S2 heard, RRR. No JVD, murmurs, gallops, clicks. 1+ pitting/chronic lower extremity edema extending to scrotal edema (no acute signs suggestive of infection) and sacral edema : all edema significantly better than 4/6. Gastrointestinal system: Abdomen is nondistended, soft and nontender. Normal bowel sounds heard. Central nervous system: Alert and oriented. No focal neurological deficits. Extremities: 5 x 5 power in left extremities. Grade 0 x 5 power in right limbs.   Data Reviewed: Basic Metabolic Panel:  Recent Labs Lab 11/12/14 1628 11/13/14 0505 11/14/14 0524 11/15/14 0525  NA 139 140 140 140  K 4.8 4.5 4.7 4.4  CL 114* 115* 113* 110  CO2 16* 17* 18* 19  GLUCOSE 146* 117* 95 112*  BUN 79* 80* 79* 86*  CREATININE 2.69* 2.66* 2.65* 2.70*  CALCIUM 8.4 8.3* 8.5 8.4   Liver Function Tests:  Recent Labs Lab 11/12/14 1628  AST 16  ALT 13  ALKPHOS 148*  BILITOT 0.6  PROT 6.3  ALBUMIN 3.1*   No results for input(s): LIPASE, AMYLASE in the last 168 hours. No results for input(s): AMMONIA in the last 168 hours. CBC:  Recent Labs Lab 11/12/14 1628 11/14/14 0524 11/15/14 0525  WBC 4.5 4.4 4.4  NEUTROABS 2.5  --   --   HGB 8.5* 8.3* 8.2*  HCT 26.6* 25.9* 25.1*  MCV 95.3 95.2 94.4  PLT 131* 139* 135*   Cardiac Enzymes:  Recent Labs Lab 11/12/14 1628  TROPONINI <0.03   BNP (last 3 results) No results for input(s): PROBNP in the last 8760 hours. CBG:  Recent Labs Lab 11/14/14 1804 11/14/14 2135 11/15/14 0728 11/15/14 1141  GLUCAP 108*  133* 105* 128*    No results found for this or any previous visit (from the past 240 hour(s)).       Studies: Dg Chest 2 View  11/15/2014   CLINICAL DATA:  Subsequent encounter for weakness and shortness of breath, re-evaluate congestive heart failure  EXAM: CHEST  2 VIEW  COMPARISON:  11/12/2014  FINDINGS: Moderate cardiac enlargement status post CABG. Aortic arch calcification. Stable vascular congestion. Increased peribronchial wall thickening. No definite interstitial edema. Significantly increased hazy opacity overlying the inferior half of the right lung. On the lateral view this appears to be related to a pleural effusion with underlying consolidation.  IMPRESSION: Cardiac enlargement with vascular congestion and increased peribronchial cuffing equivocal for minimal interstitial edema.  Increased opacity right mid to lower lung zone consisting of pleural effusion and underlying consolidation.   Electronically Signed   By: Esperanza Heir M.D.   On: 11/15/2014 09:19        Scheduled Meds: . allopurinol  100 mg Oral Daily  . finasteride  5 mg Oral Daily  . furosemide  20 mg Intravenous BID  .  heparin  5,000 Units Subcutaneous 3 times per day  . levothyroxine  137 mcg Oral QAC breakfast  . metoprolol tartrate  12.5 mg Oral BID  . simvastatin  40 mg Oral QHS  . tamsulosin  0.4 mg Oral Daily  . vitamin B-12  1,000 mcg Oral Daily   Continuous Infusions:   Principal Problem:   Anasarca Active Problems:   Atrial fibrillation   CKD (chronic kidney disease) stage 4, GFR 15-29 ml/min   Diastolic CHF, acute on chronic   Hyperlipidemia   Hypertension   Hypothyroidism   DM (diabetes mellitus)   Peripheral edema    Time spent: 40 minutes.    Marcellus Scott, MD, FACP, FHM. [facility] Pager [phone]  If 7PM-7AM, please contact night-coverage [email or web address] Password [email or web address] 11/15/2014, 1:33 PM    LOS: 1 day

## 2014-11-15 NOTE — Progress Notes (Signed)
Addendum:  Long discussion with patient's granddaughter [name]. She is actively involved in patient's care along with patient's spouse. He does have history of unsteady gait, ambulates with a walker and has fallen a couple of times this year. As per discussion with PCP, Coumadin had been discontinued due to fall risk. Counseled [name] regarding stroke risk off approximately 9.6% anually from multiple medical problems and Coumadin would reduce the stroke risks versus bleeding risks [facility] She states that patient's eventual goal is to go home from SNF. Also advised her to reconsider CODE STATUS. Discussed palliative care option. She stated that she is going to discuss all of this in detail with patient's spouse and extended family & get back to us.  Marcellus ScottHONGALGI,Rainy Rothman, MD, FACP, FHM. [facility] Pager [phone]  If 7PM-7AM, please contact night-coverage [email or web address] Password [email or web address] 11/15/2014, 4:25 PM

## 2014-11-15 NOTE — Evaluation (Addendum)
Physical Therapy Evaluation Patient Details Name: Eric Bowers MRN: [id number] DOB: 05/30/1924 Today's Date: 11/15/2014   History of Present Illness  81 yo male admitted with anasarca. Hx of stage IV CKD, DM, A fib, HTN, pleural effusion, CAD.   Clinical Impression  On eval, pt required Mod assist for mobility-able to ambulate ~40 feet with RW. Residual R sides weakness from previous stroke per pt report. Recommend return to SNF for continued rehab.     Follow Up Recommendations SNF;Supervision/Assistance - 24 hour    Equipment Recommendations  None recommended by PT    Recommendations for Other Services       Precautions / Restrictions Precautions Precautions: Fall Restrictions Weight Bearing Restrictions: No      Mobility  Bed Mobility Overal bed mobility: Needs Assistance Bed Mobility: Supine to Sit     Supine to sit: HOB elevated;Mod assist     General bed mobility comments: Assist for trunk and bil LEs. Increased time. Utilized bedpad for scooting, positioning.   Transfers Overall transfer level: Needs assistance Equipment used: Rolling walker (2 wheeled) Transfers: Sit to/from Stand Sit to Stand: Mod assist;From elevated surface         General transfer comment: Assist to rise, stabilize, control descent. Multimodal cues for safety, technique, hand placement  Ambulation/Gait Ambulation/Gait assistance: Min assist Ambulation Distance (Feet): 40 Feet (15'x1, 40'x1) Assistive device: Rolling walker (2 wheeled) Gait Pattern/deviations: Step-to pattern;Decreased dorsiflexion - right;Decreased step length - right;Decreased weight shift to right;Trunk flexed     General Gait Details: Assist to stabilize. R LE tends to drag behind. Followed closely with recliner. Seated rest break needed between walks.   Stairs            Wheelchair Mobility    Modified Rankin (Stroke Patients Only)       Balance Overall balance assessment: Needs assistance          Standing balance support: Bilateral upper extremity supported;During functional activity Standing balance-Leahy Scale: Poor Standing balance comment: nees RW                             Pertinent Vitals/Pain Pain Assessment: No/denies pain    Home Living Family/patient expects to be discharged to:: Skilled nursing facility                      Prior Function Level of Independence: Needs assistance               Hand Dominance        Extremity/Trunk Assessment   Upper Extremity Assessment: RUE deficits/detail RUE Deficits / Details: Residual weakness from prior CVA-no funtional use but pt is able to grip walker. Uses opposite UE to position R UE         Lower Extremity Assessment: Generalized weakness;RLE deficits/detail RLE Deficits / Details: Residual weakness from prior CVA. Hip flex at least 2/5, knee ext at least 3/5-noted hyperextension during gait, DF 2/5    Cervical / Trunk Assessment: Kyphotic  Communication   Communication: HOH  Cognition Arousal/Alertness: Awake/alert Behavior During Therapy: WFL for tasks assessed/performed Overall Cognitive Status: Within Functional Limits for tasks assessed                      General Comments      Exercises        Assessment/Plan    PT Assessment Patient needs continued PT services  PT Diagnosis  Difficulty walking;Abnormality of gait;Generalized weakness   PT Problem List Decreased strength;Decreased range of motion;Decreased activity tolerance;Decreased balance;Decreased mobility;Decreased knowledge of use of DME;Decreased coordination;Impaired tone  PT Treatment Interventions DME instruction;Gait training;Functional mobility training;Therapeutic activities;Therapeutic exercise;Patient/family education;Balance training   PT Goals (Current goals can be found in the Care Plan section) Acute Rehab PT Goals Patient Stated Goal: walk. get stronger PT Goal Formulation: With  patient Time For Goal Achievement: 11/29/14 Potential to Achieve Goals: Fair    Frequency Min 3X/week   Barriers to discharge        Co-evaluation               End of Session Equipment Utilized During Treatment: Gait belt Activity Tolerance: Patient tolerated treatment well Patient left: in chair;with call bell/phone within reach. Pt requested diet soda and crackers-verified with NT that this was okay before giving.            Time: 1610-9604 PT Time Calculation (min) (ACUTE ONLY): 33 min   Charges:   PT Evaluation $Initial PT Evaluation Tier I: 1 Procedure PT Treatments $Gait Training: 8-22 mins   PT G Codes:        Rebeca Alert, MPT Pager: [phone]

## 2014-11-16 LAB — GLUCOSE, CAPILLARY
GLUCOSE-CAPILLARY: 147 mg/dL — AB (ref 70–99)
Glucose-Capillary: 87 mg/dL (ref 70–99)

## 2014-11-16 LAB — BASIC METABOLIC PANEL
Anion gap: 11 (ref 5–15)
BUN: 78 mg/dL — ABNORMAL HIGH (ref 6–23)
CO2: 19 mmol/L (ref 19–32)
Calcium: 8.3 mg/dL — ABNORMAL LOW (ref 8.4–10.5)
Chloride: 107 mmol/L (ref 96–112)
Creatinine, Ser: 2.81 mg/dL — ABNORMAL HIGH (ref 0.50–1.35)
GFR, EST AFRICAN AMERICAN: 21 mL/min — AB (ref >90)
GFR, EST NON AFRICAN AMERICAN: 19 mL/min — AB (ref >90)
Glucose, Bld: 96 mg/dL (ref 70–99)
Potassium: 4.1 mmol/L (ref 3.5–5.1)
SODIUM: 137 mmol/L (ref 135–145)

## 2014-11-16 MED ORDER — FUROSEMIDE 40 MG PO TABS: 40.0000 mg | ORAL_TABLET | Freq: Two times a day (BID) | ORAL | Status: DC

## 2014-11-16 MED ORDER — LEVOTHYROXINE SODIUM 137 MCG PO TABS: 137.0000 ug | ORAL_TABLET | Freq: Every day | ORAL | Status: DC

## 2014-11-16 NOTE — Progress Notes (Signed)
Writer gave report to receiving nurse, Apolinar JunesBrandon, at [facility].

## 2014-11-16 NOTE — Progress Notes (Signed)
CSW spoke with Pt's granddaughter Marylene Landngela who is aware of discharge plan to [facility]. Victorino DikeJennifer, at [facility] is agreeable to Pt returning today.   PTAR scheduled for 2:15pm. DC packet completed and placed on chart. RN notified.  CSW signing off but available as needs arise.  Chad CordialLauren Carter, LCSWA 11/16/2014 2:42 PM [phone]

## 2014-11-16 NOTE — Discharge Summary (Signed)
Physician Discharge Summary  Eric Bowers WUJ:[id number] DOB: 1923-12-02 DOA: 11/12/2014  PCP: Vivien Presto, MD  Admit date: 11/12/2014 Discharge date: 11/16/2014  Time spent: Greater than 30 minutes  Recommendations for Outpatient Follow-up:  1. MD at SNF in 3 days with repeat labs (CBC & BMP). Adjust Lasix dose as needed. 2. Please readdress code status and consider Palliative Care Consultation at SNF 3. Check CBG's periodically.  Discharge Diagnoses:  Principal Problem:   Anasarca Active Problems:   Atrial fibrillation   CKD (chronic kidney disease) stage 4, GFR 15-29 ml/min   Diastolic CHF, acute on chronic   Hyperlipidemia   Hypertension   Hypothyroidism   DM (diabetes mellitus)   Peripheral edema   Discharge Condition: Improved & Stable  Diet recommendation: Heart Healthy & Diabetic diet.  There were no vitals filed for this visit.  History of present illness:  81 year old male with history of CKD 4, DM 2, CAD, A. fib, HLD, essential hypertension, hypothyroid, recent hospitalization at [facility] where creatinine 2.3 and BUN 58 on discharge to SNF. Patient received approximately 2 L of IV fluids at SNF for reported dehydration. Patient admitted to [facility] on 11/12/14 for worsening lower extremity and scrotal edema.  Hospital Course:   Principal Problem:  Anasarca - Multifactorial: Stage IV chronic kidney disease and acute on chronic right heart failure - Patient was diuresed aggressively with IV Lasix 40 mg BID and is - 5.6 L since admission - Monitor daily weights and strict intake and output. - Clinically improved but still has volume overload. - We'll transition to oral Lasix 40 mg twice a day and recommend close follow-up at SNF with repeat BMP and adjust Lasix dose as needed. - Baseline creatinine is 2.4. We'll have to accept some worsening of creatinine/renal function while on diuretics.  Active Problems:  Chronic Atrial fibrillation -  Controlled ventricular rate. - Beta blockers were held secondary to soft blood pressures. Resumed with holding parameters. - CHA2DS2-VASc Score: at least 7. High risk for stroke but not on anticoagulation PTA due to fall risk as discussed with PCP on 4/7. - As per wife, Coumadin was discontinued 1-2 years ago ("doctor indicated that he did not need it any longer").  - Discussed extensively with patient's family and offered them risks versus benefits of anticoagulation versus not being on anticoagulation. They have decided no anticoagulation being aware of stroke risks at this time.   CKD (chronic kidney disease) stage 4, GFR 15-29 ml/min - Discussed with PCP on 4/7: baseline creatinine: 2.4 - Outpatient follow-up with nephrology. - Not HD candidate - Monitor BMP while diuresing - Creatinine relatively stable in the 2.6 range but gradually creeping up. Follow BMP in the next couple of days and may have to accept some degree of worsening related to diuresis   Acute on chronic right heart failure with severe TR - IV Lasix as above and monitor. - Cardiology consultation appreciated. - 2-D echo results as below. - Chest x-ray consistent with pulmonary edema. - Not candidate for ACEI/ARB/Aldactone due to chronic kidney disease. - Improved. Management as above.   Hyperlipidemia - Continue statins   Hypertension - Metoprolol was temporarily held secondary to soft blood pressures. Resumed with holding parameters. - Tolerating beta blockers.   Hypothyroidism - Continue Synthroid   DM (diabetes mellitus) - Monitor CBGs. Controlled  Anemia & thrombocytopenia - May be chronic. Follow CBCs   History of CVA with right hemiplegia - High stroke risk related to chronic A.  Fib. - Family opted for no anticoagulation after being made aware of high stroke risk.  Consultants:  Cardiology  Procedures:  2-D echo: 11/14/14: Study Conclusions  - Left ventricle: The cavity size was normal.  Wall thickness was normal. Systolic function was normal. The estimated ejection fraction was in the range of 50% to 55%. Regional wall motion abnormalities cannot be excluded. - Mitral valve: There was mild regurgitation. - Left atrium: The atrium was mildly dilated. - Right atrium: The atrium was mildly dilated. - Tricuspid valve: There was severe regurgitation. - Pulmonary arteries: PA peak pressure: 40 mm Hg (S).  Impressions:  - No cardiac source of emboli was indentified.   Discharge Exam:  Complaints: Feels much better. Leg and scrotal edema have significantly improved. Denies dyspnea or chest pain.  Filed Vitals:   11/15/14 2045 11/15/14 2130 11/16/14 0446 11/16/14 1245  BP: 104/49 105/46 112/49 103/54  Pulse: 76 74 76 72  Temp: 97.9 F (36.6 C)  98.6 F (37 C) 98.1 F (36.7 C)  TempSrc: Oral  Oral Oral  Resp: 22  18 18   SpO2: 98%  100% 98%    General exam: Pleasant elderly male sitting up comfortably in chair. Respiratory system: occasional basal crackles. Rest of lung fields clear to auscultation. No increased work of breathing. Cardiovascular system: S1 & S2 heard, RRR. No JVD, murmurs, gallops, clicks. 1+ pitting/chronic lower extremity edema extending to scrotal edema (no acute signs suggestive of infection) and sacral edema : all edema significantly better than 4/6. Gastrointestinal system: Abdomen is nondistended, soft and nontender. Normal bowel sounds heard. Central nervous system: Alert and oriented. No focal neurological deficits. Extremities: 5 x 5 power in left extremities. Grade 0 x 5 power in right limbs.  Discharge Instructions      Discharge Instructions    (HEART FAILURE PATIENTS) Call MD:  Anytime you have any of the following symptoms: 1) 3 pound weight gain in 24 hours or 5 pounds in 1 week 2) shortness of breath, with or without a dry hacking cough 3) swelling in the hands, feet or stomach 4) if you have to sleep on extra pillows at  night in order to breathe.    Complete by:  As directed      Call MD for:  difficulty breathing, headache or visual disturbances    Complete by:  As directed      Call MD for:  redness, tenderness, or signs of infection (pain, swelling, redness, odor or green/yellow discharge around incision site)    Complete by:  As directed      Call MD for:  severe uncontrolled pain    Complete by:  As directed      Diet - low sodium heart healthy    Complete by:  As directed      Diet Carb Modified    Complete by:  As directed      Increase activity slowly    Complete by:  As directed             Medication List    TAKE these medications        allopurinol 100 MG tablet  Commonly known as:  ZYLOPRIM  Take 100 mg by mouth daily.     colchicine 0.6 MG tablet  Take 0.6 mg by mouth as needed (gout).     finasteride 5 MG tablet  Commonly known as:  PROSCAR  Take 5 mg by mouth daily.     furosemide 40 MG tablet  Commonly known as:  LASIX  Take 1 tablet (40 mg total) by mouth 2 (two) times daily.     levothyroxine 137 MCG tablet  Commonly known as:  SYNTHROID, LEVOTHROID  Take 1 tablet (137 mcg total) by mouth daily.     metoprolol tartrate 25 MG tablet  Commonly known as:  LOPRESSOR  Take 12.5 mg by mouth 2 (two) times daily.     simvastatin 40 MG tablet  Commonly known as:  ZOCOR  Take 40 mg by mouth at bedtime.     tamsulosin 0.4 MG Caps capsule  Commonly known as:  FLOMAX  Take 0.4 mg by mouth daily.     vitamin B-12 1000 MCG tablet  Commonly known as:  CYANOCOBALAMIN  Take 1,000 mcg by mouth daily.          The results of significant diagnostics from this hospitalization (including imaging, microbiology, ancillary and laboratory) are listed below for reference.    Significant Diagnostic Studies: Dg Chest 2 View  11/15/2014   CLINICAL DATA:  Subsequent encounter for weakness and shortness of breath, re-evaluate congestive heart failure  EXAM: CHEST  2 VIEW   COMPARISON:  11/12/2014  FINDINGS: Moderate cardiac enlargement status post CABG. Aortic arch calcification. Stable vascular congestion. Increased peribronchial wall thickening. No definite interstitial edema. Significantly increased hazy opacity overlying the inferior half of the right lung. On the lateral view this appears to be related to a pleural effusion with underlying consolidation.  IMPRESSION: Cardiac enlargement with vascular congestion and increased peribronchial cuffing equivocal for minimal interstitial edema.  Increased opacity right mid to lower lung zone consisting of pleural effusion and underlying consolidation.   Electronically Signed   By: Esperanza Heir M.D.   On: 11/15/2014 09:19   Dg Chest 2 View  11/12/2014   CLINICAL DATA:  Shortness of breath. Cough and congestion. Wheezing for 2 days.  EXAM: CHEST  2 VIEW  COMPARISON:  01/20/2004  FINDINGS: Mild enlargement of the cardiopericardial silhouette with atherosclerotic calcification of the thoracic aorta. Prior CABG.  Small bilateral pleural effusions. Tortuous descending thoracic aorta.  Density projecting over the cardiac shadow likely represents combination of tortuous aorta pleural effusion. Hiatal hernia could also appear this way but is not readily seen on the lateral projection.  Chronic fracture of the right proximal humeral shaft.  IMPRESSION: 1. Cardiomegaly with small bilateral pleural effusions. 2. Tortuous thoracic aorta. Effusion and aorta likely accounts for the retrocardiac density although a hiatal hernia could also have this appearance. 3. Chronic fracture, right proximal humerus.   Electronically Signed   By: Gaylyn Rong M.D.   On: 11/12/2014 17:31   US Abdomen Complete  11/13/2014   CLINICAL DATA:  Abdominal pain  EXAM: ULTRASOUND ABDOMEN COMPLETE  COMPARISON:  Abdominal CT 08/28/2005  FINDINGS: Gallbladder: Partly shadowing echogenic structure most consistent with stone. No wall thickening or focal tenderness.   Common bile duct: Diameter: Limited visualization, with no evidence of dilatation.  Liver: In homogeneous liver without focal mass lesion or clear cirrhotic lobulation. There is antegrade flow in the imaged portal venous system.  IVC: No abnormality visualized.  Pancreas: Visualized portion unremarkable.  Spleen: Size and appearance within normal limits.  Right Kidney: Length: 11 cm.  No mass or hydronephrosis visualized.  Left Kidney: Length: 8 cm. There is limited visualization due to narrow sonographic windows. No evidence of hydronephrosis.  Abdominal aorta: Only the proximal portion is visible, normal in diameter 2.5 cm.  Other findings: Small ascites noted around  the liver. Small right pleural effusion.  IMPRESSION: 1. Technically challenging and limited study.  No acute findings. 2. Cholelithiasis without acute cholecystitis. 3. Left renal atrophy.   Electronically Signed   By: Marnee Spring M.D.   On: 11/13/2014 00:23   US Scrotum  11/12/2014   CLINICAL DATA:  Bilateral testicle pain. Pain with changing positions. Red and swollen testicles bilaterally. History of diabetes and hypertension.  EXAM: SCROTAL ULTRASOUND  DOPPLER ULTRASOUND OF THE TESTICLES  TECHNIQUE: Complete ultrasound examination of the testicles, epididymis, and other scrotal structures was performed. Color and spectral Doppler ultrasound were also utilized to evaluate blood flow to the testicles.  COMPARISON:  None.  FINDINGS: Right testicle  Measurements: 4.0 x 2.7 x 2.4 cm. No mass or microlithiasis visualized.  Left testicle  Measurements: 3.8 x 2.3 x 2.6 cm. No mass or microlithiasis visualized.  Right epididymis: Small epididymal cyst/spermatocele is 0.9 x 0.8 x 0.7 cm. The epididymal head is mildly prominent without suspicious mass. No associated hypervascularity.  Left epididymis:  Normal in size and appearance.  Hydrocele:  Small right hydrocele present.  Varicocele:  Bilateral varicoceles are noted.  Pulsed Doppler interrogation  of both testes demonstrates normal low resistance arterial and venous waveforms bilaterally.  Additional findings:  There is marked skin thickening of the scrotum, left greater than right. Marked edema is identified without evidence for abscess.  IMPRESSION: 1. No evidence for intra-testicular mass or torsion. 2. Small right epididymal cyst/spermatocele. 3. Significant hyperemia and edema of the scrotum, left greater than right. No definite abscess identified.   Electronically Signed   By: Norva Pavlov M.D.   On: 11/12/2014 21:18   [place] Art/ven Flow Abd Pelv Doppler  11/12/2014   CLINICAL DATA:  Bilateral testicle pain. Pain with changing positions. Red and swollen testicles bilaterally. History of diabetes and hypertension.  EXAM: SCROTAL ULTRASOUND  DOPPLER ULTRASOUND OF THE TESTICLES  TECHNIQUE: Complete ultrasound examination of the testicles, epididymis, and other scrotal structures was performed. Color and spectral Doppler ultrasound were also utilized to evaluate blood flow to the testicles.  COMPARISON:  None.  FINDINGS: Right testicle  Measurements: 4.0 x 2.7 x 2.4 cm. No mass or microlithiasis visualized.  Left testicle  Measurements: 3.8 x 2.3 x 2.6 cm. No mass or microlithiasis visualized.  Right epididymis: Small epididymal cyst/spermatocele is 0.9 x 0.8 x 0.7 cm. The epididymal head is mildly prominent without suspicious mass. No associated hypervascularity.  Left epididymis:  Normal in size and appearance.  Hydrocele:  Small right hydrocele present.  Varicocele:  Bilateral varicoceles are noted.  Pulsed Doppler interrogation of both testes demonstrates normal low resistance arterial and venous waveforms bilaterally.  Additional findings:  There is marked skin thickening of the scrotum, left greater than right. Marked edema is identified without evidence for abscess.  IMPRESSION: 1. No evidence for intra-testicular mass or torsion. 2. Small right epididymal cyst/spermatocele. 3. Significant  hyperemia and edema of the scrotum, left greater than right. No definite abscess identified.   Electronically Signed   By: Norva Pavlov M.D.   On: 11/12/2014 21:18    Microbiology: No results found for this or any previous visit (from the past 240 hour(s)).   Labs: Basic Metabolic Panel:  Recent Labs Lab 11/12/14 1628 11/13/14 0505 11/14/14 0524 11/15/14 0525 11/16/14 0534  NA 139 140 140 140 137  K 4.8 4.5 4.7 4.4 4.1  CL 114* 115* 113* 110 107  CO2 16* 17* 18* 19 19  GLUCOSE 146* 117* 95  112* 96  BUN 79* 80* 79* 86* 78*  CREATININE 2.69* 2.66* 2.65* 2.70* 2.81*  CALCIUM 8.4 8.3* 8.5 8.4 8.3*   Liver Function Tests:  Recent Labs Lab 11/12/14 1628  AST 16  ALT 13  ALKPHOS 148*  BILITOT 0.6  PROT 6.3  ALBUMIN 3.1*   No results for input(s): LIPASE, AMYLASE in the last 168 hours. No results for input(s): AMMONIA in the last 168 hours. CBC:  Recent Labs Lab 11/12/14 1628 11/14/14 0524 11/15/14 0525  WBC 4.5 4.4 4.4  NEUTROABS 2.5  --   --   HGB 8.5* 8.3* 8.2*  HCT 26.6* 25.9* 25.1*  MCV 95.3 95.2 94.4  PLT 131* 139* 135*   Cardiac Enzymes:  Recent Labs Lab 11/12/14 1628  TROPONINI <0.03   BNP: BNP (last 3 results)  Recent Labs  11/12/14 1628  BNP 192.2*    ProBNP (last 3 results) No results for input(s): PROBNP in the last 8760 hours.  CBG:  Recent Labs Lab 11/15/14 1141 11/15/14 1703 11/15/14 2044 11/16/14 0725 11/16/14 1148  GLUCAP 128* 126* 142* 87 147*      Signed:  Marcellus ScottHONGALGI,Rylynn Kobs, MD, FACP, FHM. [facility] Pager [phone]  If 7PM-7AM, please contact night-coverage [email or web address] Password [email or web address] 11/16/2014, 1:04 PM

## 2014-11-16 NOTE — Progress Notes (Signed)
Pt leaving at this time with [facility]; [facility] Family aware of transfer.  Pt alert and oriented; without c/o.

## 2014-11-16 NOTE — Progress Notes (Addendum)
       Patient Name: Eric Bowers Date of Encounter: 11/16/2014    SUBJECTIVE: Feels better and states a lot of the fluid has been relieved. Denies dyspnea.  TELEMETRY:  Not on tele. Filed Vitals:   11/15/14 1438 11/15/14 2045 11/15/14 2130 11/16/14 0446  BP: 95/42 104/49 105/46 112/49  Pulse: 76 76 74 76  Temp: 97.7 F (36.5 C) 97.9 F (36.6 C)  98.6 F (37 C)  TempSrc: Oral Oral  Oral  Resp: 18 22  18   SpO2: 99% 98%  100%    Intake/Output Summary (Last 24 hours) at 11/16/14 0743 Last data filed at 11/16/14 0446  Gross per 24 hour  Intake    960 ml  Output   2025 ml  Net  -1065 ml   LABS: Basic Metabolic Panel:  Recent Labs  16/05/9603/07/16 0525 11/16/14 0534  NA 140 137  K 4.4 4.1  CL 110 107  CO2 19 19  GLUCOSE 112* 96  BUN 86* 78*  CREATININE 2.70* 2.81*  CALCIUM 8.4 8.3*   CBC:  Recent Labs  11/14/14 0524 11/15/14 0525  WBC 4.4 4.4  HGB 8.3* 8.2*  HCT 25.9* 25.1*  MCV 95.2 94.4  PLT 139* 135*    Radiology/Studies:  No new data  Physical Exam: Blood pressure 112/49, pulse 76, temperature 98.6 F (37 C), temperature source Oral, resp. rate 18, SpO2 100 %. Weight change:   Wt Readings from Last 3 Encounters:  10/05/11 198 lb (89.812 kg)  09/07/11 189 lb (85.73 kg)   Marked CV wave in jugular  Mild lower extremity edema  ASSESSMENT:  1. A/C diastolic HF with A/C right heart failure with good diuresis 2. Mild Pulmonary hypertension 3. Severe pulmonary hypertension  Plan:  1. Diuresis to baseline weight then revert to oral diuretic. 2. This is difficult as we have no prior experience or knowledge of stable weight.  Selinda EonSigned, SMITH III,HENRY W 11/16/2014, 7:43 AM

## 2014-11-16 NOTE — Progress Notes (Signed)
Family has called at this time and expressed that they DO NOT want patient back on Coumadin.

## 2014-12-23 ENCOUNTER — Encounter (HOSPITAL_COMMUNITY): Payer: Self-pay | Admitting: Emergency Medicine

## 2014-12-23 ENCOUNTER — Inpatient Hospital Stay (HOSPITAL_COMMUNITY)
Admission: EM | Admit: 2014-12-23 | Discharge: 2014-12-27 | DRG: 291 | Disposition: A | Discharge status: Skilled Nursing Facility | Payer: Medicare Other | Attending: Internal Medicine | Admitting: Internal Medicine

## 2014-12-23 ENCOUNTER — Emergency Department (HOSPITAL_COMMUNITY): Payer: Medicare Other

## 2014-12-23 DIAGNOSIS — I69351 Hemiplegia and hemiparesis following cerebral infarction affecting right dominant side: Secondary | ICD-10-CM | POA: Diagnosis not present

## 2014-12-23 DIAGNOSIS — N184 Chronic kidney disease, stage 4 (severe): Secondary | ICD-10-CM | POA: Diagnosis present

## 2014-12-23 DIAGNOSIS — I130 Hypertensive heart and chronic kidney disease with heart failure and stage 1 through stage 4 chronic kidney disease, or unspecified chronic kidney disease: Secondary | ICD-10-CM | POA: Diagnosis present

## 2014-12-23 DIAGNOSIS — E785 Hyperlipidemia, unspecified: Secondary | ICD-10-CM | POA: Diagnosis present

## 2014-12-23 DIAGNOSIS — N179 Acute kidney failure, unspecified: Secondary | ICD-10-CM | POA: Diagnosis present

## 2014-12-23 DIAGNOSIS — I50810 Right heart failure, unspecified: Secondary | ICD-10-CM

## 2014-12-23 DIAGNOSIS — R609 Edema, unspecified: Secondary | ICD-10-CM | POA: Diagnosis present

## 2014-12-23 DIAGNOSIS — I5031 Acute diastolic (congestive) heart failure: Secondary | ICD-10-CM

## 2014-12-23 DIAGNOSIS — H9190 Unspecified hearing loss, unspecified ear: Secondary | ICD-10-CM | POA: Diagnosis present

## 2014-12-23 DIAGNOSIS — E039 Hypothyroidism, unspecified: Secondary | ICD-10-CM | POA: Diagnosis present

## 2014-12-23 DIAGNOSIS — Z6828 Body mass index (BMI) 28.0-28.9, adult: Secondary | ICD-10-CM

## 2014-12-23 DIAGNOSIS — I2510 Atherosclerotic heart disease of native coronary artery without angina pectoris: Secondary | ICD-10-CM | POA: Diagnosis present

## 2014-12-23 DIAGNOSIS — H9193 Unspecified hearing loss, bilateral: Secondary | ICD-10-CM | POA: Diagnosis present

## 2014-12-23 DIAGNOSIS — I272 Other secondary pulmonary hypertension: Secondary | ICD-10-CM | POA: Diagnosis present

## 2014-12-23 DIAGNOSIS — D631 Anemia in chronic kidney disease: Secondary | ICD-10-CM | POA: Diagnosis present

## 2014-12-23 DIAGNOSIS — R601 Generalized edema: Secondary | ICD-10-CM | POA: Diagnosis not present

## 2014-12-23 DIAGNOSIS — Z885 Allergy status to narcotic agent status: Secondary | ICD-10-CM | POA: Diagnosis not present

## 2014-12-23 DIAGNOSIS — Z951 Presence of aortocoronary bypass graft: Secondary | ICD-10-CM | POA: Diagnosis not present

## 2014-12-23 DIAGNOSIS — R339 Retention of urine, unspecified: Secondary | ICD-10-CM | POA: Diagnosis present

## 2014-12-23 DIAGNOSIS — I10 Essential (primary) hypertension: Secondary | ICD-10-CM | POA: Diagnosis present

## 2014-12-23 DIAGNOSIS — E43 Unspecified severe protein-calorie malnutrition: Secondary | ICD-10-CM | POA: Diagnosis present

## 2014-12-23 DIAGNOSIS — Z515 Encounter for palliative care: Secondary | ICD-10-CM

## 2014-12-23 DIAGNOSIS — R0602 Shortness of breath: Secondary | ICD-10-CM | POA: Diagnosis not present

## 2014-12-23 DIAGNOSIS — R338 Other retention of urine: Secondary | ICD-10-CM | POA: Diagnosis not present

## 2014-12-23 DIAGNOSIS — I639 Cerebral infarction, unspecified: Secondary | ICD-10-CM | POA: Diagnosis present

## 2014-12-23 DIAGNOSIS — R54 Age-related physical debility: Secondary | ICD-10-CM | POA: Diagnosis present

## 2014-12-23 DIAGNOSIS — Z66 Do not resuscitate: Secondary | ICD-10-CM | POA: Diagnosis present

## 2014-12-23 DIAGNOSIS — N189 Chronic kidney disease, unspecified: Secondary | ICD-10-CM | POA: Diagnosis not present

## 2014-12-23 DIAGNOSIS — E1122 Type 2 diabetes mellitus with diabetic chronic kidney disease: Secondary | ICD-10-CM | POA: Diagnosis not present

## 2014-12-23 DIAGNOSIS — I5033 Acute on chronic diastolic (congestive) heart failure: Secondary | ICD-10-CM | POA: Diagnosis not present

## 2014-12-23 DIAGNOSIS — I5043 Acute on chronic combined systolic (congestive) and diastolic (congestive) heart failure: Secondary | ICD-10-CM | POA: Diagnosis not present

## 2014-12-23 DIAGNOSIS — I509 Heart failure, unspecified: Secondary | ICD-10-CM | POA: Diagnosis not present

## 2014-12-23 DIAGNOSIS — I071 Rheumatic tricuspid insufficiency: Secondary | ICD-10-CM | POA: Diagnosis present

## 2014-12-23 DIAGNOSIS — D696 Thrombocytopenia, unspecified: Secondary | ICD-10-CM | POA: Diagnosis present

## 2014-12-23 DIAGNOSIS — I4891 Unspecified atrial fibrillation: Secondary | ICD-10-CM | POA: Diagnosis present

## 2014-12-23 DIAGNOSIS — E119 Type 2 diabetes mellitus without complications: Secondary | ICD-10-CM

## 2014-12-23 DIAGNOSIS — N508 Other specified disorders of male genital organs: Secondary | ICD-10-CM | POA: Diagnosis present

## 2014-12-23 DIAGNOSIS — R0600 Dyspnea, unspecified: Secondary | ICD-10-CM | POA: Diagnosis not present

## 2014-12-23 DIAGNOSIS — I480 Paroxysmal atrial fibrillation: Secondary | ICD-10-CM | POA: Diagnosis present

## 2014-12-23 DIAGNOSIS — R3919 Other difficulties with micturition: Secondary | ICD-10-CM | POA: Diagnosis present

## 2014-12-23 DIAGNOSIS — R197 Diarrhea, unspecified: Secondary | ICD-10-CM | POA: Diagnosis present

## 2014-12-23 DIAGNOSIS — Z8249 Family history of ischemic heart disease and other diseases of the circulatory system: Secondary | ICD-10-CM

## 2014-12-23 DIAGNOSIS — I482 Chronic atrial fibrillation: Secondary | ICD-10-CM | POA: Diagnosis not present

## 2014-12-23 HISTORY — DX: Cerebral infarction, unspecified: I63.9

## 2014-12-23 LAB — BLOOD GAS, ARTERIAL
Acid-base deficit: 7.9 mmol/L — ABNORMAL HIGH (ref 0.0–2.0)
BICARBONATE: 16.4 meq/L — AB (ref 20.0–24.0)
Drawn by: 422461
FIO2: 0.21 %
O2 Saturation: 96 %
PH ART: 7.354 (ref 7.350–7.450)
Patient temperature: 98
TCO2: 15.7 mmol/L (ref 0–100)
pCO2 arterial: 30.1 mmHg — ABNORMAL LOW (ref 35.0–45.0)
pO2, Arterial: 84.5 mmHg (ref 80.0–100.0)

## 2014-12-23 LAB — BRAIN NATRIURETIC PEPTIDE: B Natriuretic Peptide: 174.5 pg/mL — ABNORMAL HIGH (ref 0.0–100.0)

## 2014-12-23 LAB — CBC
HCT: 26 % — ABNORMAL LOW (ref 39.0–52.0)
HEMOGLOBIN: 8.4 g/dL — AB (ref 13.0–17.0)
MCH: 30.8 pg (ref 26.0–34.0)
MCHC: 32.3 g/dL (ref 30.0–36.0)
MCV: 95.2 fL (ref 78.0–100.0)
PLATELETS: 153 10*3/uL (ref 150–400)
RBC: 2.73 MIL/uL — ABNORMAL LOW (ref 4.22–5.81)
RDW: 16.2 % — ABNORMAL HIGH (ref 11.5–15.5)
WBC: 5 10*3/uL (ref 4.0–10.5)

## 2014-12-23 LAB — BASIC METABOLIC PANEL
Anion gap: 13 (ref 5–15)
BUN: 93 mg/dL — AB (ref 6–20)
CHLORIDE: 108 mmol/L (ref 101–111)
CO2: 17 mmol/L — ABNORMAL LOW (ref 22–32)
Calcium: 8.3 mg/dL — ABNORMAL LOW (ref 8.9–10.3)
Creatinine, Ser: 3.34 mg/dL — ABNORMAL HIGH (ref 0.61–1.24)
GFR, EST AFRICAN AMERICAN: 17 mL/min — AB (ref >60)
GFR, EST NON AFRICAN AMERICAN: 15 mL/min — AB (ref >60)
GLUCOSE: 111 mg/dL — AB (ref 65–99)
Potassium: 4.8 mmol/L (ref 3.5–5.1)
SODIUM: 138 mmol/L (ref 135–145)

## 2014-12-23 LAB — I-STAT TROPONIN, ED: Troponin i, poc: 0 ng/mL (ref 0.00–0.08)

## 2014-12-23 LAB — TROPONIN I: Troponin I: <0.03 ng/mL (ref <0.031)

## 2014-12-23 MED ORDER — MORPHINE SULFATE 4 MG/ML IJ SOLN
4.0000 mg | Freq: Once | INTRAMUSCULAR | Status: AC
  Administered 2014-12-23: 4 mg via INTRAVENOUS
  Filled 2014-12-23: qty 1

## 2014-12-23 MED ORDER — INSULIN ASPART 100 UNIT/ML ~~LOC~~ SOLN
0.0000 [IU] | Freq: Three times a day (TID) | SUBCUTANEOUS | Status: DC
  Administered 2014-12-24 – 2014-12-25 (×3): 1 [IU] via SUBCUTANEOUS
  Administered 2014-12-26: 2 [IU] via SUBCUTANEOUS
  Administered 2014-12-27: 1 [IU] via SUBCUTANEOUS

## 2014-12-23 MED ORDER — SODIUM CHLORIDE 0.9 % IV SOLN: 250.0000 mL | INTRAVENOUS | Status: DC | PRN

## 2014-12-23 MED ORDER — SODIUM CHLORIDE 0.9 % IJ SOLN
3.0000 mL | Freq: Two times a day (BID) | INTRAMUSCULAR | Status: DC
  Administered 2014-12-23 – 2014-12-27 (×8): 3 mL via INTRAVENOUS

## 2014-12-23 MED ORDER — SODIUM CHLORIDE 0.9 % IJ SOLN
3.0000 mL | Freq: Two times a day (BID) | INTRAMUSCULAR | Status: DC
  Administered 2014-12-23 – 2014-12-26 (×5): 3 mL via INTRAVENOUS

## 2014-12-23 MED ORDER — VITAMIN B-12 1000 MCG PO TABS
1000.0000 ug | ORAL_TABLET | Freq: Every day | ORAL | Status: DC
  Administered 2014-12-24 – 2014-12-27 (×4): 1000 ug via ORAL
  Filled 2014-12-23 (×4): qty 1

## 2014-12-23 MED ORDER — PRO-STAT SUGAR FREE PO LIQD
30.0000 mL | Freq: Two times a day (BID) | ORAL | Status: DC
  Administered 2014-12-23 – 2014-12-27 (×8): 30 mL via ORAL
  Filled 2014-12-23 (×17): qty 30

## 2014-12-23 MED ORDER — FUROSEMIDE 10 MG/ML IJ SOLN
40.0000 mg | Freq: Once | INTRAMUSCULAR | Status: AC
  Administered 2014-12-23: 40 mg via INTRAVENOUS
  Filled 2014-12-23: qty 4

## 2014-12-23 MED ORDER — INSULIN ASPART 100 UNIT/ML ~~LOC~~ SOLN: 0.0000 [IU] | Freq: Every day | SUBCUTANEOUS | Status: DC

## 2014-12-23 MED ORDER — SODIUM CHLORIDE 0.9 % IJ SOLN: 3.0000 mL | INTRAMUSCULAR | Status: DC | PRN

## 2014-12-23 MED ORDER — LOPERAMIDE HCL 2 MG PO CAPS: 2.0000 mg | ORAL_CAPSULE | Freq: Four times a day (QID) | ORAL | Status: DC | PRN

## 2014-12-23 MED ORDER — FINASTERIDE 5 MG PO TABS
5.0000 mg | ORAL_TABLET | Freq: Every day | ORAL | Status: DC
  Administered 2014-12-24 – 2014-12-27 (×4): 5 mg via ORAL
  Filled 2014-12-23 (×4): qty 1

## 2014-12-23 MED ORDER — FUROSEMIDE 10 MG/ML IJ SOLN
40.0000 mg | Freq: Every day | INTRAMUSCULAR | Status: DC
  Administered 2014-12-24: 40 mg via INTRAVENOUS
  Filled 2014-12-23: qty 4

## 2014-12-23 MED ORDER — METOPROLOL TARTRATE 25 MG PO TABS
12.5000 mg | ORAL_TABLET | Freq: Two times a day (BID) | ORAL | Status: DC
  Administered 2014-12-23 – 2014-12-24 (×2): 12.5 mg via ORAL
  Filled 2014-12-23: qty 1

## 2014-12-23 MED ORDER — TAMSULOSIN HCL 0.4 MG PO CAPS
0.4000 mg | ORAL_CAPSULE | Freq: Every day | ORAL | Status: DC
  Administered 2014-12-23 – 2014-12-26 (×4): 0.4 mg via ORAL
  Filled 2014-12-23 (×6): qty 1

## 2014-12-23 MED ORDER — ENOXAPARIN SODIUM 30 MG/0.3ML ~~LOC~~ SOLN
30.0000 mg | SUBCUTANEOUS | Status: DC
  Administered 2014-12-23 – 2014-12-26 (×4): 30 mg via SUBCUTANEOUS
  Filled 2014-12-23 (×4): qty 0.3

## 2014-12-23 MED ORDER — ALLOPURINOL 100 MG PO TABS
100.0000 mg | ORAL_TABLET | Freq: Every day | ORAL | Status: DC
  Administered 2014-12-24 – 2014-12-27 (×4): 100 mg via ORAL
  Filled 2014-12-23 (×4): qty 1

## 2014-12-23 MED ORDER — LEVOTHYROXINE SODIUM 25 MCG PO TABS
150.0000 ug | ORAL_TABLET | Freq: Every day | ORAL | Status: DC
  Administered 2014-12-23 – 2014-12-26 (×4): 150 ug via ORAL
  Filled 2014-12-23: qty 2
  Filled 2014-12-23 (×2): qty 1
  Filled 2014-12-23: qty 2
  Filled 2014-12-23: qty 1
  Filled 2014-12-23 (×2): qty 2
  Filled 2014-12-23: qty 1

## 2014-12-23 MED ORDER — SIMVASTATIN 40 MG PO TABS
40.0000 mg | ORAL_TABLET | Freq: Every day | ORAL | Status: DC
  Administered 2014-12-23 – 2014-12-26 (×4): 40 mg via ORAL
  Filled 2014-12-23 (×4): qty 1

## 2014-12-23 NOTE — ED Notes (Signed)
Bed: [id number] Expected date: 12/23/14 Expected time: 7:09 PM Means of arrival: Ambulance Comments: CHF

## 2014-12-23 NOTE — H&P (Signed)
Eric Bowers is an 81 y.o. male.    Dr. Neta Mends (pcp) No cardiologist  Chief Complaint: edema HPI: 81 yo male with CAD, CHF (EF 50%), Pafib (Chads2=5), c/o increase in edema.  ? Slight sob.  Denies fever, chills, cp, palp, n/v.  + diarrhea.  For the past several day.   Past Medical History  Diagnosis Date  . Cough   . Diabetes mellitus   . CAD (coronary artery disease)   . Atrial fibrillation   . Pleural effusion   . Hyperlipidemia   . Hypertension   . Thyroid disease   . Heart disease 09/07/11  . Chronic right-sided heart failure   . Stroke     right sided weakness    Past Surgical History  Procedure Laterality Date  . Coronary artery bypass graft  1994  . Cataract extraction    . Cholecystectomy      Family History  Problem Relation Age of Onset  . Heart attack Father    Social History:  reports that he has never smoked. He does not have any smokeless tobacco history on file. He reports that he does not drink alcohol. His drug history is not on file.  Allergies:  Allergies  Allergen Reactions  . Butorphanol     Feels like climbing the walls   Medication reviewed    (Not in a hospital admission)  Results for orders placed or performed during the hospital encounter of 12/23/14 (from the past 48 hour(s))  Basic metabolic panel     Status: Abnormal   Collection Time: 12/23/14  7:40 PM  Result Value Ref Range   Sodium 138 135 - 145 mmol/L   Potassium 4.8 3.5 - 5.1 mmol/L   Chloride 108 101 - 111 mmol/L   CO2 17 (L) 22 - 32 mmol/L   Glucose, Bld 111 (H) 65 - 99 mg/dL   BUN 93 (H) 6 - 20 mg/dL   Creatinine, Ser 3.34 (H) 0.61 - 1.24 mg/dL   Calcium 8.3 (L) 8.9 - 10.3 mg/dL   GFR calc non Af Amer 15 (L) >60 mL/min   GFR calc Af Amer 17 (L) >60 mL/min    Comment: (NOTE) The eGFR has been calculated using the CKD EPI equation. This calculation has not been validated in all clinical situations. eGFR's persistently <60 mL/min signify possible Chronic  Kidney Disease.    Anion gap 13 5 - 15  CBC     Status: Abnormal   Collection Time: 12/23/14  7:40 PM  Result Value Ref Range   WBC 5.0 4.0 - 10.5 K/uL   RBC 2.73 (L) 4.22 - 5.81 MIL/uL   Hemoglobin 8.4 (L) 13.0 - 17.0 g/dL   HCT 26.0 (L) 39.0 - 52.0 %   MCV 95.2 78.0 - 100.0 fL   MCH 30.8 26.0 - 34.0 pg   MCHC 32.3 30.0 - 36.0 g/dL   RDW 16.2 (H) 11.5 - 15.5 %   Platelets 153 150 - 400 K/uL  BNP (order ONLY if patient complains of dyspnea/SOB AND you have documented it for THIS visit)     Status: Abnormal   Collection Time: 12/23/14  7:40 PM  Result Value Ref Range   B Natriuretic Peptide 174.5 (H) 0.0 - 100.0 pg/mL  I-stat troponin, ED  (not at [facility])     Status: None   Collection Time: 12/23/14  7:44 PM  Result Value Ref Range   Troponin i, poc 0.00 0.00 - 0.08 ng/mL   Comment 3  Comment: Due to the release kinetics of cTnI, a negative result within the first hours of the onset of symptoms does not rule out myocardial infarction with certainty. If myocardial infarction is still suspected, repeat the test at appropriate intervals.    Dg Chest Port 1 View  12/23/2014   CLINICAL DATA:  Cough, congestion and shortness of breath. Swollen testicles for several days.  EXAM: PORTABLE CHEST - 1 VIEW  COMPARISON:  11/15/2014  FINDINGS: Sternotomy wires unchanged. Lungs are hypoinflated with mild prominence of the perihilar markings with minimal interval improvement. Findings suggest continued mild vascular congestion. Improving hazy opacification of the right base likely small effusion/atelectasis. Borderline cardiomegaly unchanged. There is calcified plaque over the aortic arch. Old right humeral fracture. Remainder the exam is unchanged.  IMPRESSION: Borderline cardiomegaly with mild vascular congestion slightly improved compared to the previous exam. Improved opacification over the right base likely small effusions/atelectasis.   Electronically Signed   By: Marin Olp  M.D.   On: 12/23/2014 19:58    Review of Systems  Constitutional: Negative for fever, chills, weight loss, malaise/fatigue and diaphoresis.  HENT: Negative for congestion, ear discharge, ear pain, hearing loss, nosebleeds, sore throat and tinnitus.   Eyes: Negative for blurred vision, double vision, photophobia, pain, discharge and redness.  Respiratory: Positive for shortness of breath. Negative for cough, hemoptysis, sputum production, wheezing and stridor.   Cardiovascular: Positive for leg swelling. Negative for chest pain, palpitations, orthopnea, claudication and PND.  Gastrointestinal: Negative for heartburn, nausea, vomiting, abdominal pain, diarrhea, constipation, blood in stool and melena.  Genitourinary: Negative for dysuria, urgency, frequency, hematuria and flank pain.  Musculoskeletal: Negative for myalgias, back pain, joint pain, falls and neck pain.  Skin: Negative for itching and rash.  Neurological: Negative for dizziness, tingling, tremors, sensory change, speech change, focal weakness, seizures, loss of consciousness, weakness and headaches.  Endo/Heme/Allergies: Negative for environmental allergies and polydipsia. Does not bruise/bleed easily.  Psychiatric/Behavioral: Negative for depression, suicidal ideas, hallucinations, memory loss and substance abuse. The patient is not nervous/anxious and does not have insomnia.     Blood pressure 115/52, pulse 63, temperature 97.8 F (36.6 C), temperature source Oral, resp. rate 18, SpO2 99 %. Physical Exam  Constitutional: He is oriented to person, place, and time. He appears well-developed and well-nourished.  HENT:  Head: Normocephalic and atraumatic.  Mouth/Throat: No oropharyngeal exudate.  Eyes: Conjunctivae and EOM are normal. Pupils are equal, round, and reactive to light. No scleral icterus.  Neck: Normal range of motion. Neck supple. No JVD present. No tracheal deviation present. No thyromegaly present.   Cardiovascular: Exam reveals no gallop and no friction rub.   No murmur heard. Irr, irr s1 s2  Respiratory: Effort normal and breath sounds normal. No respiratory distress. He has no wheezes. He has no rales.  GI: Soft. Bowel sounds are normal. He exhibits no distension. There is no tenderness. There is no rebound and no guarding.  Musculoskeletal: Normal range of motion. He exhibits edema. He exhibits no tenderness.  Lymphadenopathy:    He has no cervical adenopathy.  Neurological: He is alert and oriented to person, place, and time. He has normal reflexes. He displays normal reflexes. No cranial nerve deficit. He exhibits normal muscle tone. Coordination normal.  Skin: Skin is warm and dry.  Psychiatric: He has a normal mood and affect. His behavior is normal. Judgment and thought content normal.     Assessment/Plan Edema Check tsh ? Secondary to protein calorie malnutrition Start on lasix 68m  Iv qday  Anemia Check cbc, iron studies, b12, folate, spep, upep  CKD stage 4,  Check spep, upep  Metabolic acidosis, seconadry to ? Diarrhea Diarrhea Check stool for fecal leukocytes, culture, c. Diff.   CHF (EF 50%)  Dm2 fsbs ac and qhs, iss  Hypothyroidism Check tsh  Pafib Cont metoprolol for rate control Cont aspirin, not on coumadin due to fall risk.  DVT prophylaxis: scd, lovenox  Jani Gravel 12/23/2014, 9:08 PM

## 2014-12-23 NOTE — ED Notes (Signed)
Pt aware of need for urine sample, urinal at bedside 

## 2014-12-23 NOTE — ED Notes (Signed)
Hopsitalist at bedside.

## 2014-12-23 NOTE — ED Provider Notes (Addendum)
CSN: [id number]     Arrival date & time 12/23/14  1924 History   First MD Initiated Contact with Patient 12/23/14 1937     Chief Complaint  Patient presents with  . Groin Swelling  . Cough     (Consider location/radiation/quality/duration/timing/severity/associated sxs/prior Treatment) The history is provided by the patient.  Eric Bowers is a 81 y.o. male hx of DM, CAD, diastolic CHF with pulmonary hypertension who presenting with shortness of breath, leg swelling. States that he has been feeling short of breath for the last week or so. Also has been coughing up some whitish sputum. States that he has been gaining weight but unable to quantify. Also has some leg and scrotal swelling. Denies any fevers or chills. States that he has something similar recently and was admitted for diuresis. Patient has been taking 40 mg Lasix twice a day.    Past Medical History  Diagnosis Date  . Cough   . Diabetes mellitus   . CAD (coronary artery disease)   . Atrial fibrillation   . Pleural effusion   . Hyperlipidemia   . Hypertension   . Thyroid disease   . Heart disease 09/07/11  . Chronic right-sided heart failure    Past Surgical History  Procedure Laterality Date  . Coronary artery bypass graft  1994   Family History  Problem Relation Age of Onset  . Heart attack Father    History  Substance Use Topics  . Smoking status: Never Smoker   . Smokeless tobacco: Not on file  . Alcohol Use: No    Review of Systems  Respiratory: Positive for cough and shortness of breath.   All other systems reviewed and are negative.     Allergies  Butorphanol  Home Medications   Prior to Admission medications   Medication Sig Start Date End Date Taking? Authorizing Provider  allopurinol (ZYLOPRIM) 100 MG tablet Take 100 mg by mouth daily. 09/11/14  Yes Historical Provider, MD  finasteride (PROSCAR) 5 MG tablet Take 5 mg by mouth daily. 06/27/14  Yes Historical Provider, MD  furosemide (LASIX)  40 MG tablet Take 1 tablet (40 mg total) by mouth 2 (two) times daily. 11/16/14  Yes Elease EtienneAnand D Hongalgi, MD  ibuprofen (ADVIL,MOTRIN) 200 MG tablet Take 200 mg by mouth every 6 (six) hours as needed for moderate pain.   Yes Historical Provider, MD  levothyroxine (SYNTHROID, LEVOTHROID) 150 MCG tablet Take 150 mcg by mouth at bedtime.   Yes Historical Provider, MD  loperamide (IMODIUM A-D) 2 MG tablet Take 2 mg by mouth 4 (four) times daily as needed for diarrhea or loose stools (diarrhea).   Yes Historical Provider, MD  metoprolol tartrate (LOPRESSOR) 25 MG tablet Take 12.5 mg by mouth 2 (two) times daily. 10/17/14 10/17/15 Yes Historical Provider, MD  simvastatin (ZOCOR) 40 MG tablet Take 40 mg by mouth at bedtime.     Yes Historical Provider, MD  Tamsulosin HCl (FLOMAX) 0.4 MG CAPS Take 0.4 mg by mouth at bedtime.    Yes Historical Provider, MD  vitamin B-12 (CYANOCOBALAMIN) 1000 MCG tablet Take 1,000 mcg by mouth daily.   Yes Historical Provider, MD  levothyroxine (SYNTHROID, LEVOTHROID) 137 MCG tablet Take 1 tablet (137 mcg total) by mouth daily. Patient not taking: Reported on 12/23/2014 11/16/14   Elease EtienneAnand D Hongalgi, MD   BP 115/59 mmHg  Pulse 69  Temp(Src) 97.8 F (36.6 C) (Oral)  Resp 17  SpO2 98% Physical Exam  Constitutional: He is oriented to person,  place, and time.  Chronically ill, uncomfortable   HENT:  Head: Normocephalic.  Mouth/Throat: Oropharynx is clear and moist.  Eyes: Conjunctivae are normal. Pupils are equal, round, and reactive to light.  Neck: Normal range of motion. Neck supple.  Cardiovascular: Normal rate, regular rhythm and normal heart sounds.   Pulmonary/Chest:  Tachypneic, crackles bilateral bases   Abdominal: Soft. Bowel sounds are normal. He exhibits no distension. There is no tenderness. There is no rebound.  Musculoskeletal:  3+ edema up to scrotum. Testicles nontender.   Neurological: He is alert and oriented to person, place, and time.  Contracted L side. Dec  strength L arm and leg (chronic from previous stroke).   Skin: Skin is warm and dry.  Psychiatric: He has a normal mood and affect. His behavior is normal. Judgment and thought content normal.  Nursing note and vitals reviewed.   ED Course  Procedures (including critical care time) Labs Review Labs Reviewed  BASIC METABOLIC PANEL - Abnormal; Notable for the following:    CO2 17 (*)    Glucose, Bld 111 (*)    BUN 93 (*)    Creatinine, Ser 3.34 (*)    Calcium 8.3 (*)    GFR calc non Af Amer 15 (*)    GFR calc Af Amer 17 (*)    All other components within normal limits  CBC - Abnormal; Notable for the following:    RBC 2.73 (*)    Hemoglobin 8.4 (*)    HCT 26.0 (*)    RDW 16.2 (*)    All other components within normal limits  BRAIN NATRIURETIC PEPTIDE - Abnormal; Notable for the following:    B Natriuretic Peptide 174.5 (*)    All other components within normal limits  URINALYSIS, ROUTINE W REFLEX MICROSCOPIC  I-STAT TROPOININ, ED    Imaging Review Dg Chest Port 1 View  12/23/2014   CLINICAL DATA:  Cough, congestion and shortness of breath. Swollen testicles for several days.  EXAM: PORTABLE CHEST - 1 VIEW  COMPARISON:  11/15/2014  FINDINGS: Sternotomy wires unchanged. Lungs are hypoinflated with mild prominence of the perihilar markings with minimal interval improvement. Findings suggest continued mild vascular congestion. Improving hazy opacification of the right base likely small effusion/atelectasis. Borderline cardiomegaly unchanged. There is calcified plaque over the aortic arch. Old right humeral fracture. Remainder the exam is unchanged.  IMPRESSION: Borderline cardiomegaly with mild vascular congestion slightly improved compared to the previous exam. Improved opacification over the right base likely small effusions/atelectasis.   Electronically Signed   By: Elberta Fortis M.D.   On: 12/23/2014 19:58     EKG Interpretation   Date/Time:  Sunday Dec 23 2014 19:34:32  EDT Ventricular Rate:  68 PR Interval:    QRS Duration: 110 QT Interval:  480 QTC Calculation: 510 R Axis:   91 Text Interpretation:  Atrial fibrillation Left posterior fascicular block  Low voltage, extremity leads Prolonged QT interval Baseline wander in  lead(s) V2 No significant change since last tracing Confirmed by Suleika Donavan  MD,  Christeen Lai ([id number]) on 12/23/2014 7:39:41 PM      MDM   Final diagnoses:  None    Eric Bowers is a 81 y.o. male here with shortness of breath, leg swelling. Likely CHF vs anasarca. Will get labs, BNP, CXR.   8:40 PM Cr elevated. BNP similar to previous. But appears volume overloaded. Given IV lasix. Will admit for renal failure, anasarca, chf.     Richardean Canal, MD 12/23/14 2045  Richardean Canalavid H Inika Bellanger, MD 12/23/14 2052

## 2014-12-23 NOTE — ED Notes (Signed)
Pt from home via [facility] c/o testicle pain and swelling and bilateral leg edema. Pt has congested cough with white sputum. Hx of CHF. Per [facility] pt has been seen over the last 5 months for same is is usually given IV lasix. Rhonci present bilaterally. 20 G Left  Forearm saline locked placed by [facility].

## 2014-12-23 NOTE — ED Notes (Signed)
EKG given to EDP Yao for review 

## 2014-12-24 ENCOUNTER — Inpatient Hospital Stay (HOSPITAL_COMMUNITY): Payer: Medicare Other

## 2014-12-24 ENCOUNTER — Ambulatory Visit (HOSPITAL_COMMUNITY): Payer: Medicare Other

## 2014-12-24 DIAGNOSIS — R338 Other retention of urine: Secondary | ICD-10-CM

## 2014-12-24 DIAGNOSIS — R0602 Shortness of breath: Secondary | ICD-10-CM

## 2014-12-24 DIAGNOSIS — I4891 Unspecified atrial fibrillation: Secondary | ICD-10-CM

## 2014-12-24 DIAGNOSIS — R609 Edema, unspecified: Secondary | ICD-10-CM

## 2014-12-24 DIAGNOSIS — R0600 Dyspnea, unspecified: Secondary | ICD-10-CM

## 2014-12-24 LAB — COMPREHENSIVE METABOLIC PANEL
ALT: 11 U/L — AB (ref 17–63)
AST: 14 U/L — ABNORMAL LOW (ref 15–41)
Albumin: 3 g/dL — ABNORMAL LOW (ref 3.5–5.0)
Alkaline Phosphatase: 127 U/L — ABNORMAL HIGH (ref 38–126)
Anion gap: 11 (ref 5–15)
BUN: 99 mg/dL — ABNORMAL HIGH (ref 6–20)
CALCIUM: 8.4 mg/dL — AB (ref 8.9–10.3)
CHLORIDE: 110 mmol/L (ref 101–111)
CO2: 19 mmol/L — ABNORMAL LOW (ref 22–32)
Creatinine, Ser: 3.38 mg/dL — ABNORMAL HIGH (ref 0.61–1.24)
GFR calc non Af Amer: 15 mL/min — ABNORMAL LOW (ref >60)
GFR, EST AFRICAN AMERICAN: 17 mL/min — AB (ref >60)
Glucose, Bld: 94 mg/dL (ref 65–99)
Potassium: 4.4 mmol/L (ref 3.5–5.1)
Sodium: 140 mmol/L (ref 135–145)
TOTAL PROTEIN: 6.1 g/dL — AB (ref 6.5–8.1)
Total Bilirubin: 0.8 mg/dL (ref 0.3–1.2)

## 2014-12-24 LAB — URINALYSIS, ROUTINE W REFLEX MICROSCOPIC
BILIRUBIN URINE: NEGATIVE
Glucose, UA: NEGATIVE mg/dL
Hgb urine dipstick: NEGATIVE
Ketones, ur: NEGATIVE mg/dL
Leukocytes, UA: NEGATIVE
NITRITE: NEGATIVE
PH: 5 (ref 5.0–8.0)
Protein, ur: NEGATIVE mg/dL
SPECIFIC GRAVITY, URINE: 1.012 (ref 1.005–1.030)
UROBILINOGEN UA: 0.2 mg/dL (ref 0.0–1.0)

## 2014-12-24 LAB — GLUCOSE, CAPILLARY
GLUCOSE-CAPILLARY: 103 mg/dL — AB (ref 65–99)
GLUCOSE-CAPILLARY: 135 mg/dL — AB (ref 65–99)
Glucose-Capillary: 88 mg/dL (ref 65–99)
Glucose-Capillary: 116 mg/dL — ABNORMAL HIGH (ref 65–99)
Glucose-Capillary: 149 mg/dL — ABNORMAL HIGH (ref 65–99)

## 2014-12-24 LAB — CBC WITH DIFFERENTIAL/PLATELET
BASOS PCT: 1 % (ref 0–1)
Basophils Absolute: 0 10*3/uL (ref 0.0–0.1)
Eosinophils Absolute: 0.6 10*3/uL (ref 0.0–0.7)
Eosinophils Relative: 14 % — ABNORMAL HIGH (ref 0–5)
HCT: 24.6 % — ABNORMAL LOW (ref 39.0–52.0)
HEMOGLOBIN: 8 g/dL — AB (ref 13.0–17.0)
Lymphocytes Relative: 21 % (ref 12–46)
Lymphs Abs: 0.8 10*3/uL (ref 0.7–4.0)
MCH: 31.1 pg (ref 26.0–34.0)
MCHC: 32.5 g/dL (ref 30.0–36.0)
MCV: 95.7 fL (ref 78.0–100.0)
MONOS PCT: 10 % (ref 3–12)
Monocytes Absolute: 0.4 10*3/uL (ref 0.1–1.0)
NEUTROS ABS: 2.2 10*3/uL (ref 1.7–7.7)
NEUTROS PCT: 54 % (ref 43–77)
Platelets: 112 10*3/uL — ABNORMAL LOW (ref 150–400)
RBC: 2.57 MIL/uL — ABNORMAL LOW (ref 4.22–5.81)
RDW: 16.3 % — ABNORMAL HIGH (ref 11.5–15.5)
WBC: 4 10*3/uL (ref 4.0–10.5)

## 2014-12-24 LAB — BASIC METABOLIC PANEL
ANION GAP: 13 (ref 5–15)
BUN: 99 mg/dL — AB (ref 6–20)
CO2: 18 mmol/L — ABNORMAL LOW (ref 22–32)
Calcium: 8.4 mg/dL — ABNORMAL LOW (ref 8.9–10.3)
Chloride: 106 mmol/L (ref 101–111)
Creatinine, Ser: 3.57 mg/dL — ABNORMAL HIGH (ref 0.61–1.24)
GFR calc non Af Amer: 14 mL/min — ABNORMAL LOW (ref >60)
GFR, EST AFRICAN AMERICAN: 16 mL/min — AB (ref >60)
Glucose, Bld: 140 mg/dL — ABNORMAL HIGH (ref 65–99)
Potassium: 4.8 mmol/L (ref 3.5–5.1)
Sodium: 137 mmol/L (ref 135–145)

## 2014-12-24 LAB — IRON AND TIBC
Iron: 30 ug/dL — ABNORMAL LOW (ref 45–182)
Saturation Ratios: 10 % — ABNORMAL LOW (ref 17.9–39.5)
TIBC: 305 ug/dL (ref 250–450)
UIBC: 275 ug/dL

## 2014-12-24 LAB — FERRITIN: FERRITIN: 40 ng/mL (ref 24–336)

## 2014-12-24 LAB — VITAMIN B12: Vitamin B-12: 792 pg/mL (ref 180–914)

## 2014-12-24 LAB — TSH: TSH: 7.095 u[IU]/mL — ABNORMAL HIGH (ref 0.350–4.500)

## 2014-12-24 LAB — TROPONIN I: Troponin I: <0.03 ng/mL (ref <0.031)

## 2014-12-24 MED ORDER — FUROSEMIDE 10 MG/ML IJ SOLN: 80.0000 mg | Freq: Two times a day (BID) | INTRAMUSCULAR | Status: DC

## 2014-12-24 MED ORDER — METOPROLOL TARTRATE 25 MG PO TABS
12.5000 mg | ORAL_TABLET | Freq: Two times a day (BID) | ORAL | Status: DC
  Administered 2014-12-25 – 2014-12-27 (×4): 12.5 mg via ORAL
  Filled 2014-12-24 (×6): qty 1

## 2014-12-24 MED ORDER — FUROSEMIDE 10 MG/ML IJ SOLN
80.0000 mg | Freq: Three times a day (TID) | INTRAMUSCULAR | Status: DC
  Administered 2014-12-24 – 2014-12-26 (×7): 80 mg via INTRAVENOUS
  Filled 2014-12-24 (×7): qty 8

## 2014-12-24 MED ORDER — GUAIFENESIN ER 600 MG PO TB12
600.0000 mg | ORAL_TABLET | Freq: Two times a day (BID) | ORAL | Status: DC
  Administered 2014-12-24 – 2014-12-27 (×6): 600 mg via ORAL
  Filled 2014-12-24 (×6): qty 1

## 2014-12-24 MED ORDER — LEVALBUTEROL HCL 0.63 MG/3ML IN NEBU
0.6300 mg | INHALATION_SOLUTION | Freq: Four times a day (QID) | RESPIRATORY_TRACT | Status: DC | PRN
  Administered 2014-12-24 – 2014-12-25 (×3): 0.63 mg via RESPIRATORY_TRACT
  Filled 2014-12-24 (×3): qty 3

## 2014-12-24 NOTE — Progress Notes (Signed)
  Echocardiogram 2D Echocardiogram has been performed.  Kristalyn Bergstresser FRANCES 12/24/2014, 4:05 PM

## 2014-12-24 NOTE — Progress Notes (Signed)
RN paged this NP earlier in shift secondary to pt having more labored breathing and sounding congested. He had just received his ordered Lasix IV for overload/CHF. O2 sat normal on RA. BP soft 104. HR 80s. Stat CXR ordered and NP to bedside. S: States he is SOB but feels somewhat better. Having a difficult time getting secretions up even with stern coughing. Denies chest pain.  O: Chronically ill appearing, elderly WM in mild distress with coughing but is non toxic appearing. He is able to speak in sentences without SOB. No increased WOB noted. He seems SOB only with these coughing spells. VS reviewed. On RA. Lungs: overall congestion more in upper lobes, but no crackles. Good air exchange. A/P: 1. Mild respiratory distress-don't feel this is worsening overload as his Lasix was increased today and no crackles are auscultated. Xopenex now and q6. O2 2L for comfort and to keep sats > 92%. CXR showed better aeration since last one. No increased pulmonary edema, smaller effusions.  Suspicious for possible PNA, but CXR neg. Add Mucinex to assist in secretions. Continue Lasix tid.  2. Elevated creat-will recheck BMP since increased Lasix today.  Will follow. Should he worsen, will speak to family given his DNR status.  Jimmye NormanKaren Kirby-Graham, NP [facility]

## 2014-12-24 NOTE — Progress Notes (Signed)
PATIENT DETAILS Name: Eric Bowers Age: 81 y.o. Sex: male Date of Birth: 02/27/1924 Admit Date: 12/23/2014 Admitting Physician Pearson GrippeJames Kim, MD ZOX:[id number],JXBPCP:CORRINGTON,KIP A, MD  Subjective: Very hard of hearing-still with significant lower extremity edema and scrotal swelling.  Assessment/Plan: Principal Problem:   Anasarca: Secondary to right-sided heart failure and worsening renal function. Still with significant lower extremity edema.Increase Lasix to 80 mg IV 3 times a day, place Foley catheter. Daily weights, strict intake/output.   Acute on chronic right heart failure with severe TR: on IV Lasix.See above. 2-D echo on 11/14/14 showed EF around 50-55% with PA pressure around 40 mmHg with severe TR. No indication to repeat echocardiogram at this time. Remains significantly decompensated with anasarca.      Acute on chronic kidney disease stage IV: ARF from worsening heart failure. Very difficult situation, clearly volume overloaded and will need aggressive diuresis. Would expect some worsening of renal function. Long discussion with patient's granddaughter at bedside-she is aware that patient is not a hemodialysis candidate. We will continue to follow clinical course closely, if renal function significantly worsens-family is aware that hospice would need to be initiated.    Acute urinary retention: Approximately 400 mL of residual urine seen on bladder ultrasound this morning, Foley catheter will be placed. Continue finasteride and Flomax. Voiding trial prior to discharge.    Diarrhea:seems to have resolved. Follow    Chronic atrial fibrillation: Rate controlled with metoprolol.CHA2DS2-VASc Score: at least 7. Not on anticoagulation-spoke with patient's granddaughter-they have extensively discussed with their PCP and primary cardiologist as an outpatient-and have decided on no anticoagulation. Family/patient went off cardioembolic risk including CVA.   Hyperlipidemia: Continue  statins  Hypertension: Cautiously continue with Metoprolo with holding parameters.  Hypothyroidism:Continue Synthroid  DM (diabetes mellitus): CBGs controlled with SSI-check A1c  Anemia: Secondary to chronic kidney disease, follow CBCs periodically   Thrombocytopenia:chronic issue-follow CBCs  periodically   History of CVA with right hemiplegia: High stroke risk related to chronic A. Fib. Family opted for no anticoagulation-see above  Disposition: Remain inpatient-either home with hospice or SNF on discharge-requires few more days of hospitalization. PT eval ordered  Antimicrobial agents  See below  Anti-infectives    None      DVT Prophylaxis: Prophylactic Lovenox-watch platelets closely  Code Status: DNR  Family Communication Granddaughter at bedside  Procedures: None  CONSULTS:  None  Time spent 40 minutes-Greater than 50% of this time was spent in counseling, explanation of diagnosis, planning of further management, and coordination of care.  MEDICATIONS: Scheduled Meds: . allopurinol  100 mg Oral Daily  . enoxaparin (LOVENOX) injection  30 mg Subcutaneous Q24H  . feeding supplement (PRO-STAT SUGAR FREE 64)  30 mL Oral BID  . finasteride  5 mg Oral Daily  . furosemide  80 mg Intravenous 3 times per day  . insulin aspart  0-5 Units Subcutaneous QHS  . insulin aspart  0-9 Units Subcutaneous TID WC  . levothyroxine  150 mcg Oral QHS  . metoprolol tartrate  12.5 mg Oral BID  . simvastatin  40 mg Oral QHS  . sodium chloride  3 mL Intravenous Q12H  . sodium chloride  3 mL Intravenous Q12H  . tamsulosin  0.4 mg Oral QHS  . vitamin B-12  1,000 mcg Oral Daily   Continuous Infusions:  PRN Meds:.sodium chloride, loperamide, sodium chloride    PHYSICAL EXAM: Vital signs in last 24 hours: Filed Vitals:  12/23/14 2100 12/23/14 2209 12/24/14 0438 12/24/14 0504  BP: 115/52 105/62 103/63   Pulse: 63 63 67   Temp:  98 F (36.7 C) 97.7 F (36.5 C)     TempSrc:  Oral Oral   Resp: 18 19 19    Height:  5\' 7"  (1.702 m)    Weight:  83.7 kg (184 lb 8.4 oz)  82.9 kg (182 lb 12.2 oz)  SpO2: 99% 93% 94%     Weight change:  Filed Weights   12/23/14 2209 12/24/14 0504  Weight: 83.7 kg (184 lb 8.4 oz) 82.9 kg (182 lb 12.2 oz)   Body mass index is 28.62 kg/(m^2).   Gen Exam: Awake and alert with clear speech. Very hard of hearing Neck: Supple,+ JVD. Chest: B/L Clear-but poor effort CVS: S1 S2 Regular, no murmurs. Abdomen: soft, BS +, non tender, non distended.  Extremities:+++ edema, lower extremities warm to touch. Significantly enlarged scrotum Neurologic: Right-sided hemiplegia-chronic Skin: No Rash.  Wounds: N/A.   Intake/Output from previous day:  Intake/Output Summary (Last 24 hours) at 12/24/14 1309 Last data filed at 12/24/14 1040  Gross per 24 hour  Intake    480 ml  Output    400 ml  Net     80 ml     LAB RESULTS: CBC  Recent Labs Lab 12/23/14 1940 12/24/14 0740  WBC 5.0 4.0  HGB 8.4* 8.0*  HCT 26.0* 24.6*  PLT 153 112*  MCV 95.2 95.7  MCH 30.8 31.1  MCHC 32.3 32.5  RDW 16.2* 16.3*  LYMPHSABS  --  0.8  MONOABS  --  0.4  EOSABS  --  0.6  BASOSABS  --  0.0    Chemistries   Recent Labs Lab 12/23/14 1940 12/24/14 0740  NA 138 140  K 4.8 4.4  CL 108 110  CO2 17* 19*  GLUCOSE 111* 94  BUN 93* 99*  CREATININE 3.34* 3.38*  CALCIUM 8.3* 8.4*    CBG:  Recent Labs Lab 12/24/14 0003 12/24/14 0746 12/24/14 1149  GLUCAP 103* 88 116*    GFR Estimated Creatinine Clearance: 15.3 mL/min (by C-G formula based on Cr of 3.38).  Coagulation profile No results for input(s): INR, PROTIME in the last 168 hours.  Cardiac Enzymes  Recent Labs Lab 12/23/14 2308 12/24/14 0740  TROPONINI <0.03 <0.03    Invalid input(s): POCBNP No results for input(s): DDIMER in the last 72 hours. No results for input(s): HGBA1C in the last 72 hours. No results for input(s): CHOL, HDL, LDLCALC, TRIG, CHOLHDL,  LDLDIRECT in the last 72 hours.  Recent Labs  12/23/14 2308  TSH 7.095*    Recent Labs  12/23/14 2308  VITAMINB12 792  FERRITIN 40  TIBC 305  IRON 30*   No results for input(s): LIPASE, AMYLASE in the last 72 hours.  Urine Studies No results for input(s): UHGB, CRYS in the last 72 hours.  Invalid input(s): UACOL, UAPR, USPG, UPH, UTP, UGL, UKET, UBIL, UNIT, UROB, ULEU, UEPI, UWBC, URBC, UBAC, CAST, UCOM, BILUA  MICROBIOLOGY: No results found for this or any previous visit (from the past 240 hour(s)).  RADIOLOGY STUDIES/RESULTS: Dg Chest Port 1 View  12/23/2014   CLINICAL DATA:  Cough, congestion and shortness of breath. Swollen testicles for several days.  EXAM: PORTABLE CHEST - 1 VIEW  COMPARISON:  11/15/2014  FINDINGS: Sternotomy wires unchanged. Lungs are hypoinflated with mild prominence of the perihilar markings with minimal interval improvement. Findings suggest continued mild vascular congestion. Improving hazy opacification of the  right base likely small effusion/atelectasis. Borderline cardiomegaly unchanged. There is calcified plaque over the aortic arch. Old right humeral fracture. Remainder the exam is unchanged.  IMPRESSION: Borderline cardiomegaly with mild vascular congestion slightly improved compared to the previous exam. Improved opacification over the right base likely small effusions/atelectasis.   Electronically Signed   By: Elberta Fortis M.D.   On: 12/23/2014 19:58    Jeoffrey Massed, MD  [facility] Pager:[phone]  If 7PM-7AM, please contact night-coverage [email or web address] Password [email or web address] 12/24/2014, 1:09 PM   LOS: 1 day

## 2014-12-24 NOTE — Progress Notes (Signed)
VASCULAR LAB PRELIMINARY  PRELIMINARY  PRELIMINARY  PRELIMINARY  Bilateral lower extremity venous duplex completed.    Preliminary report: Technically difficult due to edema  Bilateral:  No obvious evidence of DVT, superficial thrombosis, or Baker's Cyst.   Kanden Carey, RVS 12/24/2014, 11:02 AM

## 2014-12-24 NOTE — Progress Notes (Signed)
Pt had no urine output since he was admitted to the floor, ED gave him lasix, Bladder scan showed 150-25500ml. N.P informed and ordered In and out. In and out done drained 400ml of urine . Will continue to monitor.

## 2014-12-24 NOTE — Plan of Care (Signed)
Problem: Phase I Progression Outcomes Goal: Voiding-avoid urinary catheter unless indicated Outcome: Not Progressing Cath placed 12/24/2014

## 2014-12-24 NOTE — Progress Notes (Signed)
Rapid Response called to check patient. Patient sounds congested and respirations slightly labored. Pt has an unproductive cough. O2 sats on R/A is 96%. Vital signs are stable: p-76, r-20, B/P-106/62.  Maren ReamerKaren Kirby, NP in to assess patient.

## 2014-12-25 DIAGNOSIS — I482 Chronic atrial fibrillation: Secondary | ICD-10-CM

## 2014-12-25 DIAGNOSIS — E119 Type 2 diabetes mellitus without complications: Secondary | ICD-10-CM

## 2014-12-25 LAB — GLUCOSE, CAPILLARY
GLUCOSE-CAPILLARY: 87 mg/dL (ref 65–99)
GLUCOSE-CAPILLARY: 141 mg/dL — AB (ref 65–99)
Glucose-Capillary: 124 mg/dL — ABNORMAL HIGH (ref 65–99)
Glucose-Capillary: 162 mg/dL — ABNORMAL HIGH (ref 65–99)

## 2014-12-25 LAB — UIFE/LIGHT CHAINS/TP QN, 24-HR UR
% BETA, Urine: 11.7 %
ALPHA 1 URINE: 1.9 %
Albumin, U: 63.9 %
Alpha 2, Urine: 5.3 %
FREE KAPPA/LAMBDA RATIO: 5.9 (ref 2.04–10.37)
FREE LAMBDA LT CHAINS, UR: 3.46 mg/L (ref 0.24–6.66)
FREE LT CHN EXCR RATE: 20.4 mg/L (ref 1.35–24.19)
GAMMA GLOBULIN URINE: 17.2 %
Total Protein, Urine: 17.3 mg/dL
VOLUME, URINE-UPE24: 60 mL

## 2014-12-25 LAB — PROTEIN ELECTROPHORESIS, SERUM
A/G Ratio: 1 (ref 0.7–2.0)
Albumin ELP: 2.9 g/dL — ABNORMAL LOW (ref 3.2–5.6)
Alpha-1-Globulin: 0.3 g/dL (ref 0.1–0.4)
Alpha-2-Globulin: 0.6 g/dL (ref 0.4–1.2)
Beta Globulin: 0.8 g/dL (ref 0.6–1.3)
GLOBULIN, TOTAL: 3 g/dL (ref 2.0–4.5)
Gamma Globulin: 1.2 g/dL (ref 0.5–1.6)
TOTAL PROTEIN ELP: 5.9 g/dL — AB (ref 6.0–8.5)

## 2014-12-25 MED ORDER — CETYLPYRIDINIUM CHLORIDE 0.05 % MT LIQD
7.0000 mL | Freq: Two times a day (BID) | OROMUCOSAL | Status: DC
  Administered 2014-12-25 – 2014-12-27 (×4): 7 mL via OROMUCOSAL

## 2014-12-25 MED ORDER — LORAZEPAM 1 MG PO TABS
1.0000 mg | ORAL_TABLET | Freq: Every day | ORAL | Status: DC
  Administered 2014-12-25 – 2014-12-26 (×2): 1 mg via ORAL
  Filled 2014-12-25 (×2): qty 1

## 2014-12-25 MED ORDER — MORPHINE SULFATE (CONCENTRATE) 10 MG/0.5ML PO SOLN: 5.0000 mg | ORAL | Status: DC | PRN

## 2014-12-25 MED ORDER — CHLORHEXIDINE GLUCONATE 0.12 % MT SOLN
15.0000 mL | Freq: Two times a day (BID) | OROMUCOSAL | Status: DC
  Administered 2014-12-25 – 2014-12-27 (×4): 15 mL via OROMUCOSAL
  Filled 2014-12-25 (×5): qty 15

## 2014-12-25 MED ORDER — HYDROCOD POLST-CPM POLST ER 10-8 MG/5ML PO SUER
5.0000 mL | Freq: Two times a day (BID) | ORAL | Status: DC | PRN
  Administered 2014-12-25 – 2014-12-26 (×2): 5 mL via ORAL
  Filled 2014-12-25 (×2): qty 5

## 2014-12-25 MED ORDER — LEVALBUTEROL HCL 0.63 MG/3ML IN NEBU
0.6300 mg | INHALATION_SOLUTION | Freq: Four times a day (QID) | RESPIRATORY_TRACT | Status: DC
  Administered 2014-12-25 – 2014-12-27 (×9): 0.63 mg via RESPIRATORY_TRACT
  Filled 2014-12-25 (×9): qty 3

## 2014-12-25 MED ORDER — LORAZEPAM 2 MG/ML PO CONC: 1.0000 mg | Freq: Every day | ORAL | Status: DC

## 2014-12-25 NOTE — Clinical Documentation Improvement (Signed)
  H&P states "chronic right sided CHF". In the Coding world this equates to "heart failure" which is nonspecific. 5/15 ECHO states EF 40-45% with systolic dysfunction. Please clarify the Type of CHF being treated.  . Document acuity --Acute --Chronic --Acute on Chronic . Document type --Diastolic --Systolic --Combined systolic and diastolic  Thank Harle BattiestYou,  Boyd Buffalo RN CDI [phone] HIM department

## 2014-12-25 NOTE — Clinical Social Work Placement (Signed)
   CLINICAL SOCIAL WORK PLACEMENT  NOTE  Date:  12/25/2014  Patient Details  Name: Eric Bowers MRN: [id number] Date of Birth: 12/14/1923  Clinical Social Work is seeking post-discharge placement for this patient at the Skilled  Nursing Facility level of care (*CSW will initial, date and re-position this form in  chart as items are completed):  Yes   Patient/family provided with [facility] Social Work Department's list of facilities offering this level of care within the geographic area requested by the patient (or if unable, by the patient's family).  Yes   Patient/family informed of their freedom to choose among providers that offer the needed level of care, that participate in Medicare, Medicaid or managed care program needed by the patient, have an available bed and are willing to accept the patient.  Yes   Patient/family informed of [facility]'s ownership interest in [facility] and [facility], as well as of the fact that they are under no obligation to receive care at these facilities.  PASRR submitted to EDS on       PASRR number received on       Existing PASRR number confirmed on 12/25/14     FL2 transmitted to all facilities in geographic area requested by pt/family on 12/25/14     FL2 transmitted to all facilities within larger geographic area on       Patient informed that his/her managed care company has contracts with or will negotiate with certain facilities, including the following:            Patient/family informed of bed offers received.  Patient chooses bed at       Physician recommends and patient chooses bed at      Patient to be transferred to   on  .  Patient to be transferred to facility by       Patient family notified on   of transfer.  Name of family member notified:        PHYSICIAN       Additional Comment:   [facility] _______________________________________________ Loletta SpecterKIDD, SUZANNA A, LCSW 12/25/2014, 4:20 PM

## 2014-12-25 NOTE — Evaluation (Signed)
Physical Therapy Evaluation Patient Details Name: Eric Bowers MRN: [id number] DOB: 10/08/1923 Today's Date: 12/25/2014   History of Present Illness  81 yo male admitted with anasarca, acute on chronic right heart failure and acute on chronic kidney disease stage IV. Hx of stage IV CKD, DM, A fib, HTN, pleural effusion, CAD, diastolic CHF with pulmonary hypertension, CVA with R sided hemiplegia  Clinical Impression  Pt admitted with above diagnosis. Pt currently with functional limitations due to the deficits listed below (see PT Problem List).  Pt will benefit from skilled PT to increase their independence and safety with mobility to allow discharge to the venue listed below.  Pt poor historian but agreeable to mobilize.  Pt requiring total assist for bed mobility at this time so did not feel safe to transfer OOB today.  Pt also appeared uncomfortable so returned to supine with pt attempting to reposition and not stating needs adequately, however realized scrotal area likely uncomfortable so elevated scrotum and pt appeared more relieved and able to lay still.  Pt remained on 3L O2 [name].     Follow Up Recommendations SNF;Supervision/Assistance - 24 hour    Equipment Recommendations  Wheelchair (measurements PT);Hospital bed;Other (comment) (pt poor historian, unknown equip, recommend hoyer lift)    Recommendations for Other Services       Precautions / Restrictions Precautions Precautions: Fall Precaution Comments: edematous scrotum, hx R hemiplegia      Mobility  Bed Mobility Overal bed mobility: Needs Assistance;+2 for physical assistance Bed Mobility: Supine to Sit;Sit to Supine     Supine to sit: Total assist;HOB elevated Sit to supine: Total assist   General bed mobility comments: Pt unable to functionally assist with R extremities during bed mobility, pt required increased assist to sit upright and remain upright, pt requesting to transfer to recliner however not safe at this  time due to assist level, pt assisted back to supine and appeared very uncomfortable attempting to return to sitting, difficulty stating scrotum was poorly positioned, elevated scrotum and pt reported feeling better, relief.  Transfers                    Ambulation/Gait                Stairs            Wheelchair Mobility    Modified Rankin (Stroke Patients Only)       Balance Overall balance assessment: Needs assistance Sitting-balance support: Bilateral upper extremity supported;Feet supported Sitting balance-Leahy Scale: Poor                                       Pertinent Vitals/Pain Pain Assessment: Faces Faces Pain Scale: Hurts whole lot Pain Location: scrotum Pain Descriptors / Indicators: Discomfort;Grimacing Pain Intervention(s): Monitored during session;Repositioned (scrotum elevated)    Home Living Family/patient expects to be discharged to:: Private residence Living Arrangements: Spouse/significant other               Additional Comments: pt poor historian, mentions home and also facility    Prior Function Level of Independence: Needs assistance   Gait / Transfers Assistance Needed: pt states he ambulates with RW and w/c following?           Hand Dominance        Extremity/Trunk Assessment   Upper Extremity Assessment: RUE deficits/detail RUE Deficits / Details: residual deficits from  CVA, unable to functionally use R UE         Lower Extremity Assessment: RLE deficits/detail;Generalized weakness RLE Deficits / Details: residual deficits from CVA       Communication   Communication: HOH  Cognition Arousal/Alertness: Awake/alert Behavior During Therapy: WFL for tasks assessed/performed Overall Cognitive Status: No family/caregiver present to determine baseline cognitive functioning                      General Comments      Exercises        Assessment/Plan    PT Assessment  Patient needs continued PT services  PT Diagnosis Acute pain;Generalized weakness   PT Problem List Decreased strength;Decreased balance;Decreased mobility;Decreased safety awareness;Decreased cognition;Pain  PT Treatment Interventions DME instruction;Balance training;Patient/family education;Functional mobility training;Therapeutic activities;Wheelchair mobility training;Therapeutic exercise;Gait training   PT Goals (Current goals can be found in the Care Plan section) Acute Rehab PT Goals PT Goal Formulation: With patient Time For Goal Achievement: 01/01/15 Potential to Achieve Goals: Fair    Frequency Min 2X/week   Barriers to discharge        Co-evaluation               End of Session   Activity Tolerance: Patient limited by pain Patient left: in bed;with call bell/phone within reach;with bed alarm set Nurse Communication: Mobility status;Need for lift equipment         Time: 1041-1103 PT Time Calculation (min) (ACUTE ONLY): 22 min   Charges:   PT Evaluation $Initial PT Evaluation Tier I: 1 Procedure     PT G Codes:        Amelie Caracci,KATHrine E 12/25/2014, 1:23 PM Zenovia JarredKati Shae Hinnenkamp, PT, DPT 12/25/2014 Pager: [phone]

## 2014-12-25 NOTE — Progress Notes (Addendum)
RT did assessment on pt. Pt scored a 10 rt made pt q6 nebs due to pt called at 22:21, 05:44, and 08:25 for a ned tx. Pt has RH/BS, weak NPC, sats 100% on 3LPM [place], pt complains of SOB. Pt had rapid response called 12/24/14.Pt in no distress at this time.

## 2014-12-25 NOTE — Progress Notes (Signed)
Pt w/ congested, NP cough. PRN Tussionex given. Pt O2 sat 99% on RA.

## 2014-12-25 NOTE — Progress Notes (Addendum)
PATIENT DETAILS Name: Eric Bowers Age: 81 y.o. Sex: male Date of Birth: 05/11/1924 Admit Date: 12/23/2014 Admitting Physician Pearson GrippeJames Kim, MD NFA:[id number],ONGPCP:CORRINGTON,KIP A, MD  Subjective: Upset that breakfast was provided at 10 AM and that nurses take a long time to respond. Continues to have edema in his legs.  Assessment/Plan: Principal Problem:   Anasarca: Secondary to right-sided heart failure and worsening renal function. Still with significant lower extremity edema.Continue Lasix to 80 mg IV 3 times a day. Daily weights, strict intake/output.   Acute on chronic right heart failure with severe TR: on IV Lasix.See above. 2-D echo on 11/14/14 showed EF around 50-55% with PA pressure around 40 mmHg with severe TR.  Remains significantly decompensated with anasarca-diuretics as above      Acute on chronic kidney disease stage IV: ARF from worsening heart failure. Very difficult situation, clearly volume overloaded and will need aggressive diuresis. Would expect some worsening of renal function. Long discussion with patient's granddaughter at bedside on 5/16-she is aware that patient is not a hemodialysis candidate. Will continue attempts at diuresis-continue to monitor renal function. If acutely/dramatically worsens then we will consult palliative care    Acute urinary retention: Approximately 400 mL of residual urine seen on bladder ultrasound on 5/16, Foley catheter will be placed. Continue finasteride and Flomax. Voiding trial prior to discharge.    Diarrhea:seems to have resolved spontaneously. Follow    Chronic atrial fibrillation: Rate controlled with metoprolol.CHA2DS2-VASc Score: at least 7. Not on anticoagulation-spoke with patient's granddaughter-they have extensively discussed with their PCP and primary cardiologist as an outpatient-and have decided on no anticoagulation. Family/patient went off cardioembolic risk including CVA.   Hyperlipidemia: Continue  statins  Hypertension: Cautiously continue with Metoprolo with holding parameters.  Hypothyroidism:Continue Synthroid  DM (diabetes mellitus): CBGs controlled with SSI  Anemia: Secondary to chronic kidney disease, follow CBCs periodically   Thrombocytopenia:chronic issue-follow CBCs  periodically   History of CVA with right hemiplegia: High stroke risk related to chronic A. Fib. Family opted for no anticoagulation-see above    Palliative care: Very frail elderly gentleman-with anasarca from worsening right-sided heart failure and worsening renal function. This M.D. has had multiple discussions with patient and also with the patient's granddaughter-current plans are to diurese as much as possible. Both patient and family aware that if acutely deteriorates then we will not escalate care any further-we will then transition to comfort/hospice measures. However if patient improves and stabilizes-plans up to discharge to SNF or home with hospice follow-up. As noted above patient is a DO NOT RESUSCITATE  Addendum 5:30 pm: long d/w spouse and grand daughter at bedside. They are aware of poor overall prognoses-explained right sided CHF/Renal failure causing anasarca and that diuresis is difficult given worsening renal function. Spouse is aware and agreeable that no further escalation in care-in event of deterioration/worsening-we will transition to comfort measures. Spouse would like to continue with IV lasix-but start some as needed morphine for comfort and Ativan qhs for sleep. Family aware that patient will likely need discharge to SNF-with palliative care follow up. For now-plans are to continue with IV Lasix and attempt diuresis as much as possible.   Disposition: Remain inpatient-suspect will require SNF on discharge.   Antimicrobial agents  See below  Anti-infectives    None      DVT Prophylaxis: Prophylactic Lovenox-watch platelets closely  Code Status: DNR  Family  Communication Granddaughter over the phone  Procedures: None  CONSULTS:  None  Time spent 30 minutes-Greater than 50% of this time was spent in counseling, explanation of diagnosis, planning of further management, and coordination of care.  MEDICATIONS: Scheduled Meds: . allopurinol  100 mg Oral Daily  . antiseptic oral rinse  7 mL Mouth Rinse q12n4p  . chlorhexidine  15 mL Mouth Rinse BID  . enoxaparin (LOVENOX) injection  30 mg Subcutaneous Q24H  . feeding supplement (PRO-STAT SUGAR FREE 64)  30 mL Oral BID  . finasteride  5 mg Oral Daily  . furosemide  80 mg Intravenous 3 times per day  . guaiFENesin  600 mg Oral BID  . insulin aspart  0-5 Units Subcutaneous QHS  . insulin aspart  0-9 Units Subcutaneous TID WC  . levalbuterol  0.63 mg Nebulization Q6H  . levothyroxine  150 mcg Oral QHS  . metoprolol tartrate  12.5 mg Oral BID  . simvastatin  40 mg Oral QHS  . sodium chloride  3 mL Intravenous Q12H  . sodium chloride  3 mL Intravenous Q12H  . tamsulosin  0.4 mg Oral QHS  . vitamin B-12  1,000 mcg Oral Daily   Continuous Infusions:  PRN Meds:.sodium chloride, levalbuterol, loperamide, sodium chloride    PHYSICAL EXAM: Vital signs in last 24 hours: Filed Vitals:   12/25/14 0647 12/25/14 0800 12/25/14 0825 12/25/14 0834  BP: 109/55 98/53    Pulse: 78 80    Temp: 97.5 F (36.4 C)     TempSrc: Oral     Resp: 18 24    Height:      Weight: 84 kg (185 lb 3 oz)     SpO2: 100% 100% 100% 100%    Weight change: 0.3 kg (10.6 oz) Filed Weights   12/23/14 2209 12/24/14 0504 12/25/14 0647  Weight: 83.7 kg (184 lb 8.4 oz) 82.9 kg (182 lb 12.2 oz) 84 kg (185 lb 3 oz)   Body mass index is 29 kg/(m^2).   Gen Exam: Awake and alert with clear speech. Very hard of hearing. Not in any distress Neck: Supple,+ JVD. Chest: B/L Clear-but poor effort CVS: S1 S2 Regular, no murmurs. Abdomen: soft, BS +, non tender, non distended.  Extremities:+++ edema, lower extremities warm to  touch. Significantly enlarged scrotum Neurologic: Right-sided hemiplegia-chronic Skin: No Rash.  Wounds: N/A.   Intake/Output from previous day:  Intake/Output Summary (Last 24 hours) at 12/25/14 1216 Last data filed at 12/25/14 1000  Gross per 24 hour  Intake    440 ml  Output   1200 ml  Net   -760 ml     LAB RESULTS: CBC  Recent Labs Lab 12/23/14 1940 12/24/14 0740  WBC 5.0 4.0  HGB 8.4* 8.0*  HCT 26.0* 24.6*  PLT 153 112*  MCV 95.2 95.7  MCH 30.8 31.1  MCHC 32.3 32.5  RDW 16.2* 16.3*  LYMPHSABS  --  0.8  MONOABS  --  0.4  EOSABS  --  0.6  BASOSABS  --  0.0    Chemistries   Recent Labs Lab 12/23/14 1940 12/24/14 0740 12/24/14 2207  NA 138 140 137  K 4.8 4.4 4.8  CL 108 110 106  CO2 17* 19* 18*  GLUCOSE 111* 94 140*  BUN 93* 99* 99*  CREATININE 3.34* 3.38* 3.57*  CALCIUM 8.3* 8.4* 8.4*    CBG:  Recent Labs Lab 12/24/14 1149 12/24/14 1654 12/24/14 2108 12/25/14 0755 12/25/14 1146  GLUCAP 116* 149* 135* 87 124*    GFR Estimated Creatinine  Clearance: 14.5 mL/min (by C-G formula based on Cr of 3.57).  Coagulation profile No results for input(s): INR, PROTIME in the last 168 hours.  Cardiac Enzymes  Recent Labs Lab 12/23/14 2308 12/24/14 0740 12/24/14 1413  TROPONINI <0.03 <0.03 <0.03    Invalid input(s): POCBNP No results for input(s): DDIMER in the last 72 hours. No results for input(s): HGBA1C in the last 72 hours. No results for input(s): CHOL, HDL, LDLCALC, TRIG, CHOLHDL, LDLDIRECT in the last 72 hours.  Recent Labs  12/23/14 2308  TSH 7.095*    Recent Labs  12/23/14 2308  VITAMINB12 792  FERRITIN 40  TIBC 305  IRON 30*   No results for input(s): LIPASE, AMYLASE in the last 72 hours.  Urine Studies No results for input(s): UHGB, CRYS in the last 72 hours.  Invalid input(s): UACOL, UAPR, USPG, UPH, UTP, UGL, UKET, UBIL, UNIT, UROB, ULEU, UEPI, UWBC, URBC, UBAC, CAST, UCOM, BILUA  MICROBIOLOGY: No results  found for this or any previous visit (from the past 240 hour(s)).  RADIOLOGY STUDIES/RESULTS: Dg Chest Port 1 View  12/24/2014   CLINICAL DATA:  81 year old male with bilateral upper extremity edema worse on the right than the left  EXAM: PORTABLE CHEST - 1 VIEW  COMPARISON:  Prior chest x-ray 12/23/2014  FINDINGS: Stable cardiomegaly and aortic atherosclerosis. Patient is status post median sternotomy with evidence of prior multivessel CABG. Improved aeration in the lung bases since the prior chest x-ray. Persistent residual small right pleural effusion and associated right basilar atelectasis. Chronic bronchitic change similar compared to prior. Nonunion of remote right proximal humeral fracture.  IMPRESSION: 1. Improved aeration in the lung bases likely secondary to a combination of better inspiratory volumes and decreased pleural effusion. 2. Stable cardiomegaly.   Electronically Signed   By: Malachy MoanHeath  McCullough M.D.   On: 12/24/2014 22:56   Dg Chest Port 1 View  12/23/2014   CLINICAL DATA:  Cough, congestion and shortness of breath. Swollen testicles for several days.  EXAM: PORTABLE CHEST - 1 VIEW  COMPARISON:  11/15/2014  FINDINGS: Sternotomy wires unchanged. Lungs are hypoinflated with mild prominence of the perihilar markings with minimal interval improvement. Findings suggest continued mild vascular congestion. Improving hazy opacification of the right base likely small effusion/atelectasis. Borderline cardiomegaly unchanged. There is calcified plaque over the aortic arch. Old right humeral fracture. Remainder the exam is unchanged.  IMPRESSION: Borderline cardiomegaly with mild vascular congestion slightly improved compared to the previous exam. Improved opacification over the right base likely small effusions/atelectasis.   Electronically Signed   By: Elberta Fortisaniel  Boyle M.D.   On: 12/23/2014 19:58    Jeoffrey MassedGHIMIRE,SHANKER, MD  [facility] Pager:[phone]  If 7PM-7AM, please contact  night-coverage [email or web address] Password [email or web address] 12/25/2014, 12:16 PM   LOS: 2 days

## 2014-12-25 NOTE — Clinical Social Work Note (Signed)
Clinical Social Work Assessment  Patient Details  Name: Eric Bowers MRN: [id number] Date of Birth: [birth year]  Date of referral:  12/25/14               Reason for consult:  Discharge Planning                Permission sought to share information with:  Family Supports Permission granted to share information::  Yes, Verbal Permission Granted  Name::     Eric Bowers; Eric Bowers/granddaughter  Agency::  Initiation of SNF search to [facility]  Relationship::  pt wife and pt granddaughter  Sport and exercise psychologist Information:  QMVH/[phone] and granddaughter/[phone]  Housing/Transportation Living arrangements for the past 2 months:  Single Family Home Source of Information:  Friend/Neighbor Patient Interpreter Needed:  None Criminal Activity/Legal Involvement Pertinent to Current Situation/Hospitalization:  No - Comment as needed Significant Relationships:  Spouse, Other Family Members Lives with:  Spouse Do you feel safe going back to the place where you live?  No Need for family participation in patient care:  Yes (Comment)  Care giving concerns:  Pt admitted from home with wife and pt unable to transfer or ambulate at this time.   Social Worker assessment / plan:  CSW received referral from MD that pt will need SNF upon d/c. Per MD, pt will need palliative care to follow at SNF as well.   CSW met with pt, pt spouse, and pt granddaughter at bedside. CSW introduced self and explained role. Pt voiced concerns about pain and concern that medication is not assisting his fluid build up and feels that the MD needs to consider at paracentesis. CSW encouraged pt to discuss with MD.   Pt wife reports that pt was able to ambulate at home prior to admission, but pt wife is concerned about the amount of assistance that pt is requiring. CSW discussed SNF for rehabilitation. Pt wife discussed that pt was recently at [facility] and pt wife feels  that pt will need a facility again upon discharge. Pt wife expressed concern about pt continued need for hospitalization and CSW discussed that MD is recommending palliative care services to follow at SNF in order to monitor pt progress and continue discussion about goals of care. Pt granddaughter stated that she has discussed with pt and pt wife about hospice services. CSW discussed that insurance will not cover SNF facility if pt under full hospice services, but insurance will cover SNF if pt participating in therapy and palliative care services can be involved. Pt wife and pt granddaughter expressed understanding.   Pt wife shared that [facility] is close to her home, but it was discouraging to pt to see long term residents at the facility and pt wife curious if there is a facility that has a better lay out than [facility]. CSW provided education surrounding that most facilities have short term rehab section separate from long term care section, but there is still the chance that pt would be exposed to the patients that are at the facility long term. Pt wife reviewed SNF list and interested in exploring [facility]. Pt wife and pt granddaughter plan to tour [facility] in order to determine if they feel that it is a better set up than [facility].   CSW completed FL2 and initiated SNF search to [facility] and [facility].   CSW to continue to follow to provide support and assist with pt disposition needs.   Employment status:  Retired Forensic scientist:  Medicare PT Recommendations:  [place] / Referral to community resources:  [place]  Patient/Family's Response to care:  Pt alert and oriented x 4. Pt expressing concern about pain and fluid-RN present at bedside. Pt wife and pt granddaughter supportive and actively involved in pt care. Pt wife does not feel that she can continue to manage pt care needs at home.    Patient/Family's Understanding of and Emotional Response to Diagnosis, Current Treatment, and Prognosis:  Pt anxious and voicing concern about pain in arm and fluid build up. Pt expressed frustration that he does not feel that MD current interventions are helping and wants MD to consider paracentesis. Pt wife expressed that pt condition is chronic and pt wife wants to prevent further rehospitalization's.   Emotional Assessment Appearance:  Appears stated age Attitude/Demeanor/Rapport:  Complaining (pt voiced concerns about pain and feeling that the medication is not improving his fluid build up) Affect (typically observed):  Anxious Orientation:  Oriented to Self, Oriented to Place, Oriented to  Time, Oriented to Situation Alcohol / Substance use:  Not Applicable Psych involvement (Current and /or in the community):  No (Comment)  Discharge Needs  Concerns to be addressed:  Discharge Planning Concerns Readmission within the last 30 days:  No Current discharge risk:  Dependent with Mobility, Chronically ill Barriers to Discharge:  Continued Medical Work up   Alison Murray A, LCSW 12/25/2014, 3:55 PM  [phone]

## 2014-12-26 DIAGNOSIS — I5043 Acute on chronic combined systolic (congestive) and diastolic (congestive) heart failure: Secondary | ICD-10-CM

## 2014-12-26 DIAGNOSIS — N179 Acute kidney failure, unspecified: Secondary | ICD-10-CM

## 2014-12-26 DIAGNOSIS — N184 Chronic kidney disease, stage 4 (severe): Secondary | ICD-10-CM

## 2014-12-26 DIAGNOSIS — R601 Generalized edema: Secondary | ICD-10-CM

## 2014-12-26 LAB — BASIC METABOLIC PANEL
ANION GAP: 11 (ref 5–15)
BUN: 106 mg/dL — ABNORMAL HIGH (ref 6–20)
CHLORIDE: 106 mmol/L (ref 101–111)
CO2: 22 mmol/L (ref 22–32)
CREATININE: 3.47 mg/dL — AB (ref 0.61–1.24)
Calcium: 8.3 mg/dL — ABNORMAL LOW (ref 8.9–10.3)
GFR calc Af Amer: 17 mL/min — ABNORMAL LOW (ref >60)
GFR calc non Af Amer: 14 mL/min — ABNORMAL LOW (ref >60)
GLUCOSE: 105 mg/dL — AB (ref 65–99)
Potassium: 3.8 mmol/L (ref 3.5–5.1)
Sodium: 139 mmol/L (ref 135–145)

## 2014-12-26 LAB — GLUCOSE, CAPILLARY
GLUCOSE-CAPILLARY: 169 mg/dL — AB (ref 65–99)
GLUCOSE-CAPILLARY: 153 mg/dL — AB (ref 65–99)
Glucose-Capillary: 141 mg/dL — ABNORMAL HIGH (ref 65–99)
Glucose-Capillary: 111 mg/dL — ABNORMAL HIGH (ref 65–99)
Glucose-Capillary: 106 mg/dL — ABNORMAL HIGH (ref 65–99)

## 2014-12-26 MED ORDER — IPRATROPIUM BROMIDE 0.02 % IN SOLN
0.5000 mg | Freq: Three times a day (TID) | RESPIRATORY_TRACT | Status: DC
  Administered 2014-12-26 – 2014-12-27 (×4): 0.5 mg via RESPIRATORY_TRACT
  Filled 2014-12-26 (×4): qty 2.5

## 2014-12-26 MED ORDER — GLYCOPYRROLATE 1 MG PO TABS
1.0000 mg | ORAL_TABLET | Freq: Two times a day (BID) | ORAL | Status: DC
  Administered 2014-12-26 – 2014-12-27 (×3): 1 mg via ORAL
  Filled 2014-12-26 (×6): qty 1

## 2014-12-26 MED ORDER — HYDROXYZINE HCL 25 MG PO TABS: 25.0000 mg | ORAL_TABLET | Freq: Three times a day (TID) | ORAL | Status: DC | PRN

## 2014-12-26 MED ORDER — FUROSEMIDE 10 MG/ML IJ SOLN
120.0000 mg | Freq: Three times a day (TID) | INTRAMUSCULAR | Status: DC
Start: 2014-12-26 — End: 2014-12-27
  Administered 2014-12-26 – 2014-12-27 (×3): 120 mg via INTRAVENOUS
  Filled 2014-12-26 (×4): qty 12

## 2014-12-26 NOTE — Progress Notes (Signed)
CSW continuing to follow.   CSW visited pt room. Pt sleeping soundly at bedside. No family present at this time.   CSW contacted pt granddaughter, Marylene Landngela via telephone to follow up from conversation with pt and pt family yesterday.   Pt granddaughter discussed that pt granddaughter and pt wife explored [facility], but feel that pt will be more comfortable [facility] and want pt to go to [facility] upon discharge. Pt granddaughter wants to ensure that palliative care services will be following at SNF.   CSW contacted [facility] and left message with admissions coordinator to notify that pt and pt family want to accept SNF bed offer.   CSW to continue to follow to provide support and assist with pt disposition needs when pt medically ready for discharge.  Loletta SpecterSuzanna Rayner Erman, MSW, LCSW Clinical Social Work [phone]

## 2014-12-26 NOTE — Clinical Social Work Placement (Signed)
   CLINICAL SOCIAL WORK PLACEMENT  NOTE  Date:  12/26/2014  Patient Details  Name: Eric Bowers MRN: [id number] Date of Birth: 12/06/1923  Clinical Social Work is seeking post-discharge placement for this patient at the Skilled  Nursing Facility level of care (*CSW will initial, date and re-position this form in  chart as items are completed):  Yes   Patient/family provided with [place] Clinical Social Work Department's list of facilities offering this level of care within the geographic area requested by the patient (or if unable, by the patient's family).  Yes   Patient/family informed of their freedom to choose among providers that offer the needed level of care, that participate in Medicare, Medicaid or managed care program needed by the patient, have an available bed and are willing to accept the patient.  Yes   Patient/family informed of [place]'s ownership interest in [facility] and [facility], as well as of the fact that they are under no obligation to receive care at these facilities.  PASRR submitted to EDS on       PASRR number received on       Existing PASRR number confirmed on 12/25/14     FL2 transmitted to all facilities in geographic area requested by pt/family on 12/25/14     FL2 transmitted to all facilities within larger geographic area on       Patient informed that his/her managed care company has contracts with or will negotiate with certain facilities, including the following:        Yes   Patient/family informed of bed offers received.  [facility]     Physician recommends and patient chooses bed at      Patient to be transferred to   on  .  Patient to be transferred to facility by       Patient family notified on   of transfer.  Name of family member notified:        PHYSICIAN Please sign FL2, Please sign DNR     Additional Comment:    _______________________________________________ Orson EvaKIDD,  SUZANNA A, LCSW 12/26/2014, 2:46 PM

## 2014-12-26 NOTE — Progress Notes (Addendum)
[facility] PROGRESS NOTE  Eric Bowers [id number] DOB: 02/14/1924 DOA: 12/23/2014 PCP: Vivien PrestoORRINGTON,KIP A, MD   Brief narrative 81 year old male with coronary artery disease, CHF, diabetes mellitus, paroxysmal A. Fib, history of CVA with right hemiplegia admitted with anasarca in setting of acute on chronic right heart failure with severe TR. Hospital course complicated with acute on chronic kidney disease stage IV. Patient has worsened prognosis with progressive renal dysfunction and anasarca.  Assessment/Plan: Acute on chronic right heart failure with severe tricuspid regurgitation and anasarca On Lasix 80 mg IV 3 times a day. Has good urine output in the past 24 hours. Will increase Lasix to 160 mg IV 3 times a day. Monitor strict I/O and daily weight. Not a candidate for further aggressive treatment.  Acute on chronic kidney disease stage IV Likely cardiorenal syndrome. Patient not a hemodialysis candidate. Continue aggressive diuresis for now and monitor. Recheck BMET  Acute urinary retention On 5/16. Foley catheter in place. Continue Flomax and finasteride. Attempt voiding trial prior to discharge.  Chronic A. fib Rate controlled with metoprolol.CHA2DS2-VASc Score: at least 7. Not on anticoagulation due to fall risk after extensive discussion of family with patient's PCP and primary cardiologist.  History of CVA with right hemiplegia Continue statin. Not on anticoagulation as per above.  Diabetes mellitus Stable sinus insulin  Hypertension Continue metoprolol  Anemia of chronic disease Stable.  Thrombocytopenia Chronic  Persistent cough Patient scheduled and when necessary antitussives.add glycopyrrolate. Ordered xopenex and atrovent nebs.  Hypothyroidism Continue Synthroid  DVT prophylaxis: On SCDs   Code Status: DO NOT RESUSCITATE Family Communication: Wife and granddaughter at bedside Disposition Plan: Skilled nursing facility possibly on 5/19  with palliative care follow-up   Consultants:  none  Procedures:  none  Antibiotics:  none  HPI/Subjective: Seen and examined. Continues to have productive cough and short of breath.  Objective: Filed Vitals:   12/26/14 1355  BP: 100/64  Pulse: 68  Temp: 97.7 F (36.5 C)  Resp: 20    Intake/Output Summary (Last 24 hours) at 12/26/14 1632 Last data filed at 12/26/14 1356  Gross per 24 hour  Intake    600 ml  Output   1450 ml  Net   -850 ml   Filed Weights   12/24/14 0504 12/25/14 0647 12/26/14 0627  Weight: 82.9 kg (182 lb 12.2 oz) 84 kg (185 lb 3 oz) 83.2 kg (183 lb 6.8 oz)    Exam:   General:  Elderly frail male lying in bed in no acute distress, appears fatigued  HEENT: No pallor, JVD +, moist oral mucosa  Chest: Coarse bilateral breath sounds, no rhonchi or wheeze  CNS: Normal S1 and S2, systolic murmur 4/6  GI: Soft, nontender, nondistended, bowel sounds present, Foley in place  Musculoskeletal: 1+pitting edema b/l    Data Reviewed: Basic Metabolic Panel:  Recent Labs Lab 12/23/14 1940 12/24/14 0740 12/24/14 2207  NA 138 140 137  K 4.8 4.4 4.8  CL 108 110 106  CO2 17* 19* 18*  GLUCOSE 111* 94 140*  BUN 93* 99* 99*  CREATININE 3.34* 3.38* 3.57*  CALCIUM 8.3* 8.4* 8.4*   Liver Function Tests:  Recent Labs Lab 12/24/14 0740  AST 14*  ALT 11*  ALKPHOS 127*  BILITOT 0.8  PROT 6.1*  ALBUMIN 3.0*   No results for input(s): LIPASE, AMYLASE in the last 168 hours. No results for input(s): AMMONIA in the last 168 hours. CBC:  Recent Labs Lab 12/23/14 1940 12/24/14 0740  WBC 5.0  4.0  NEUTROABS  --  2.2  HGB 8.4* 8.0*  HCT 26.0* 24.6*  MCV 95.2 95.7  PLT 153 112*   Cardiac Enzymes:  Recent Labs Lab 12/23/14 2308 12/24/14 0740 12/24/14 1413  TROPONINI <0.03 <0.03 <0.03   BNP (last 3 results)  Recent Labs  11/12/14 1628 12/23/14 1940  BNP 192.2* 174.5*    ProBNP (last 3 results) No results for input(s):  PROBNP in the last 8760 hours.  CBG:  Recent Labs Lab 12/25/14 1655 12/25/14 2139 12/26/14 0040 12/26/14 0751 12/26/14 1151  GLUCAP 141* 162* 141* 111* 169*    No results found for this or any previous visit (from the past 240 hour(s)).   Studies: Dg Chest Port 1 View  12/24/2014   CLINICAL DATA:  81 year old male with bilateral upper extremity edema worse on the right than the left  EXAM: PORTABLE CHEST - 1 VIEW  COMPARISON:  Prior chest x-ray 12/23/2014  FINDINGS: Stable cardiomegaly and aortic atherosclerosis. Patient is status post median sternotomy with evidence of prior multivessel CABG. Improved aeration in the lung bases since the prior chest x-ray. Persistent residual small right pleural effusion and associated right basilar atelectasis. Chronic bronchitic change similar compared to prior. Nonunion of remote right proximal humeral fracture.  IMPRESSION: 1. Improved aeration in the lung bases likely secondary to a combination of better inspiratory volumes and decreased pleural effusion. 2. Stable cardiomegaly.   Electronically Signed   By: Malachy MoanHeath  McCullough M.D.   On: 12/24/2014 22:56    Scheduled Meds: . allopurinol  100 mg Oral Daily  . antiseptic oral rinse  7 mL Mouth Rinse q12n4p  . chlorhexidine  15 mL Mouth Rinse BID  . enoxaparin (LOVENOX) injection  30 mg Subcutaneous Q24H  . feeding supplement (PRO-STAT SUGAR FREE 64)  30 mL Oral BID  . finasteride  5 mg Oral Daily  . furosemide  120 mg Intravenous 3 times per day  . glycopyrrolate  1 mg Oral BID  . guaiFENesin  600 mg Oral BID  . insulin aspart  0-5 Units Subcutaneous QHS  . insulin aspart  0-9 Units Subcutaneous TID WC  . ipratropium  0.5 mg Nebulization 3 times per day  . levalbuterol  0.63 mg Nebulization Q6H  . levothyroxine  150 mcg Oral QHS  . LORazepam  1 mg Oral QHS  . metoprolol tartrate  12.5 mg Oral BID  . simvastatin  40 mg Oral QHS  . sodium chloride  3 mL Intravenous Q12H  . sodium chloride  3  mL Intravenous Q12H  . tamsulosin  0.4 mg Oral QHS  . vitamin B-12  1,000 mcg Oral Daily   Continuous Infusions:     Time spent: 25 minutes    Eddie NorthDHUNGEL, Starlee Corralejo  [facility] Pager [phone] If 7PM-7AM, please contact night-coverage at [email or web address], password [email or web address] 12/26/2014, 4:32 PM  LOS: 3 days

## 2014-12-27 DIAGNOSIS — N179 Acute kidney failure, unspecified: Secondary | ICD-10-CM | POA: Diagnosis present

## 2014-12-27 DIAGNOSIS — I10 Essential (primary) hypertension: Secondary | ICD-10-CM | POA: Diagnosis present

## 2014-12-27 DIAGNOSIS — N184 Chronic kidney disease, stage 4 (severe): Secondary | ICD-10-CM

## 2014-12-27 DIAGNOSIS — I5033 Acute on chronic diastolic (congestive) heart failure: Principal | ICD-10-CM

## 2014-12-27 DIAGNOSIS — I50810 Right heart failure, unspecified: Secondary | ICD-10-CM

## 2014-12-27 DIAGNOSIS — I509 Heart failure, unspecified: Secondary | ICD-10-CM

## 2014-12-27 DIAGNOSIS — E43 Unspecified severe protein-calorie malnutrition: Secondary | ICD-10-CM | POA: Diagnosis present

## 2014-12-27 LAB — GLUCOSE, CAPILLARY
GLUCOSE-CAPILLARY: 124 mg/dL — AB (ref 65–99)
Glucose-Capillary: 103 mg/dL — ABNORMAL HIGH (ref 65–99)

## 2014-12-27 MED ORDER — HYDROXYZINE HCL 25 MG PO TABS: 25.0000 mg | ORAL_TABLET | Freq: Three times a day (TID) | ORAL | Status: AC | PRN

## 2014-12-27 MED ORDER — PRO-STAT SUGAR FREE PO LIQD: 30.0000 mL | Freq: Two times a day (BID) | ORAL | Status: AC

## 2014-12-27 MED ORDER — CHLORHEXIDINE GLUCONATE 0.12 % MT SOLN: 15.0000 mL | Freq: Two times a day (BID) | OROMUCOSAL | Status: AC

## 2014-12-27 MED ORDER — CETYLPYRIDINIUM CHLORIDE 0.05 % MT LIQD: 7.0000 mL | Freq: Two times a day (BID) | OROMUCOSAL | Status: AC

## 2014-12-27 MED ORDER — MORPHINE SULFATE 10 MG/5ML PO SOLN: 5.0000 mg | Freq: Four times a day (QID) | ORAL | Status: AC | PRN

## 2014-12-27 MED ORDER — POTASSIUM CHLORIDE ER 20 MEQ PO TBCR: 10.0000 meq | EXTENDED_RELEASE_TABLET | Freq: Two times a day (BID) | ORAL | Status: AC

## 2014-12-27 MED ORDER — GUAIFENESIN ER 600 MG PO TB12: 600.0000 mg | ORAL_TABLET | Freq: Two times a day (BID) | ORAL | Status: AC

## 2014-12-27 MED ORDER — IPRATROPIUM-ALBUTEROL 0.5-2.5 (3) MG/3ML IN SOLN: 3.0000 mL | RESPIRATORY_TRACT | Status: AC | PRN

## 2014-12-27 MED ORDER — LORAZEPAM 1 MG PO TABS: 1.0000 mg | ORAL_TABLET | Freq: Every day | ORAL | Status: AC

## 2014-12-27 MED ORDER — FUROSEMIDE 40 MG PO TABS: 80.0000 mg | ORAL_TABLET | Freq: Three times a day (TID) | ORAL | Status: AC

## 2014-12-27 MED ORDER — LEVALBUTEROL HCL 0.63 MG/3ML IN NEBU: 0.6300 mg | INHALATION_SOLUTION | Freq: Three times a day (TID) | RESPIRATORY_TRACT | Status: DC

## 2014-12-27 NOTE — Discharge Summary (Signed)
Physician Discharge Summary  Eric GuardianCharles G Bowers [id number] DOB: 06/24/1924 DOA: 12/23/2014  PCP: Vivien PrestoORRINGTON,KIP A, MD  Admit date: 12/23/2014 Discharge date: 12/27/2014  Time spent: 35 minutes  Recommendations for Outpatient Follow-up:  1. [facility] 2. Please have patient  evaluated and followed by palliative care at the facility. Long-term prognosis is guarded. 3. Patient is being discharged on Foley catheter due to acute urinary retention and would benefit from it while on high-dose Lasix. Please attempt voiding trial in 1-2 weeks.  Discharge Diagnoses:  Principal problem Anasarca  Active Problems:    Acute right-sided CHF    Acute renal failure superimposed on stage 4 chronic kidney disease   DM (diabetes mellitus)   Stroke, hx of   Atrial fibrillation   Diastolic CHF, acute on chronic   Essential hypertension, benign   Protein-calorie malnutrition, severe   Severe tricuspid regurgitation  Discharge Condition: Guarded  Diet recommendation: Heart healthy, with nutrition supplements  Code status: DO NOT RESUSCITATE  Filed Weights   12/25/14 0647 12/26/14 0627 12/27/14 0507  Weight: 84 kg (185 lb 3 oz) 83.2 kg (183 lb 6.8 oz) 83.4 kg (183 lb 13.8 oz)    History of present illness:  Please refer to admission H&P for details, in brief, 81 year old male with coronary artery disease, CHF, diabetes mellitus, paroxysmal A. Fib, history of CVA with right hemiplegia admitted with anasarca in setting of acute on chronic right heart failure with severe TR. Hospital course complicated with acute on chronic kidney disease stage IV. Patient has worsened prognosis with progressive renal dysfunction and anasarca.  Hospital Course:  Acute on chronic right heart failure with severe tricuspid regurgitation and anasarca Patient required high dose of IV Lasix and on 5/18 was placed on Lasix drip. Had some improvement in his urine output and  clinical symptoms. Overall prognosis is grim and patient is not a candidate for any further aggressive treatment. I will discharge him on oral Lasix 80 mg 3 times a day. Added potassium supplements.  Acute on chronic kidney disease stage IV Likely cardiorenal syndrome. Patient not a hemodialysis candidate. Renal function slightly better with aggressive diuresis. Overall prognosis is poor. Monitor renal function in 3-4 days.   Acute urinary retention On 5/16. Foley catheter in place. Continue Flomax and finasteride. Please attempt voiding trial in 1-2 weeks.  Chronic A. fib Rate controlled with metoprolol.CHA2DS2-VASc Score: at least 7. Not on anticoagulation due to fall risk after extensive discussion of family with patient's PCP and primary cardiologist.  History of CVA with right hemiplegia Continue statin. Not on anticoagulation as per above.  Diabetes mellitus Stable on sliding scale insulin in the hospital. Not on any medications at home.  Hypertension Continue metoprolol  Anemia of chronic disease Stable.  Thrombocytopenia Chronic  Persistent cough Symptoms better with high-dose Lasix and antitussives. Continue when necessary nebulizers.  Hypothyroidism Continue Synthroid  We have had extensive discussion with patient's wife and her granddaughter during patient's hospital stay. Discussed that patient's overall prognosis is poor with limitation to further escalation of care. Patient was made DO NOT RESUSCITATE with plan on comfort measures/hospice if further deterioration in symptoms. Since patient's symptoms have plateaued over the past 48 hours, he will be discharged to skilled nursing facility with recommendation for palliative care follow-up. Patient has been started on oral morphine when necessary for comfort and Ativan at bedtime for sleep.    Family Communication: Discussed with granddaughter on the phone.  Disposition Plan: Skilled nursing facility  Consultants:  none  Procedures:  none  Antibiotics:  none   Discharge Exam: Filed Vitals:   12/27/14 0507  BP: 107/48  Pulse: 72  Temp: 97.5 F (36.4 C)  Resp: 20     General: Elderly frail male lying in bed in no acute distress, appears fatigued  HEENT: No pallor, JVD +, moist oral mucosa  Chest: Coarse bilateral breath sounds, no rhonchi or wheeze  CNS: Normal S1 and S2, systolic murmur 4/6  GI: Soft, nontender, nondistended, bowel sounds present, Foley in place  Musculoskeletal: 2+ pitting edema bilaterally  CNS: Awake but lethargic, oriented to place and person  Discharge Instructions    Current Discharge Medication List    START taking these medications   Details  Amino Acids-Protein Hydrolys (FEEDING SUPPLEMENT, PRO-STAT SUGAR FREE 64,) LIQD Take 30 mLs by mouth 2 (two) times daily. Qty: 900 mL, Refills: 0    antiseptic oral rinse (CPC / CETYLPYRIDINIUM CHLORIDE 0.05%) 0.05 % LIQD solution 7 mLs by Mouth Rinse route 2 times daily at 12 noon and 4 pm. Qty: 44 mL, Refills: 0    chlorhexidine (PERIDEX) 0.12 % solution 15 mLs by Mouth Rinse route 2 (two) times daily. Qty: 120 mL, Refills: 0    guaiFENesin (MUCINEX) 600 MG 12 hr tablet Take 1 tablet (600 mg total) by mouth 2 (two) times daily. Qty: 10 tablet, Refills: 0    hydrOXYzine (ATARAX/VISTARIL) 25 MG tablet Take 1 tablet (25 mg total) by mouth 3 (three) times daily as needed for itching. Qty: 30 tablet, Refills: 0    ipratropium-albuterol (DUONEB) 0.5-2.5 (3) MG/3ML SOLN Take 3 mLs by nebulization every 4 (four) hours as needed. For shortness of breath/wheezing Qty: 360 mL, Refills: 0    LORazepam (ATIVAN) 1 MG tablet Take 1 tablet (1 mg total) by mouth at bedtime. Qty: 15 tablet, Refills: 0    morphine 10 MG/5ML solution Take 2.5 mLs (5 mg total) by mouth every 6 (six) hours as needed for severe pain. Qty: 15 mL, Refills: 0  Potassium chloride (K-Dur) tablet 20 mg  Take 1 tablet 2  times a day                                                                      Qty:30 tablets, no refills    CONTINUE these medications which have CHANGED   Details  furosemide (LASIX) 40 MG tablet Take 2 tablets (80 mg total) by mouth 3 (three) times daily after meals. Qty: 90 tablet, Refills: 0       CONTINUE these medications which have NOT CHANGED   Details  allopurinol (ZYLOPRIM) 100 MG tablet Take 100 mg by mouth daily. Refills: 3    finasteride (PROSCAR) 5 MG tablet Take 5 mg by mouth daily.    levothyroxine (SYNTHROID, LEVOTHROID) 150 MCG tablet Take 150 mcg by mouth at bedtime.    loperamide (IMODIUM A-D) 2 MG tablet Take 2 mg by mouth 4 (four) times daily as needed for diarrhea or loose stools (diarrhea).    metoprolol tartrate (LOPRESSOR) 25 MG tablet Take 12.5 mg by mouth 2 (two) times daily.    simvastatin (ZOCOR) 40 MG tablet Take 40 mg by mouth at bedtime.      Tamsulosin HCl (  FLOMAX) 0.4 MG CAPS Take 0.4 mg by mouth at bedtime.     vitamin B-12 (CYANOCOBALAMIN) 1000 MCG tablet Take 1,000 mcg by mouth daily.      STOP taking these medications     ibuprofen (ADVIL,MOTRIN) 200 MG tablet        Allergies  Allergen Reactions  . Butorphanol     Feels like climbing the walls   Follow-up Information    Please follow up.   Why:  MD at SNF       The results of significant diagnostics from this hospitalization (including imaging, microbiology, ancillary and laboratory) are listed below for reference.    Significant Diagnostic Studies: Dg Chest Port 1 View  12/24/2014   CLINICAL DATA:  81 year old male with bilateral upper extremity edema worse on the right than the left  EXAM: PORTABLE CHEST - 1 VIEW  COMPARISON:  Prior chest x-ray 12/23/2014  FINDINGS: Stable cardiomegaly and aortic atherosclerosis. Patient is status post median sternotomy with evidence of prior multivessel CABG. Improved aeration in the lung bases since the prior chest x-ray. Persistent  residual small right pleural effusion and associated right basilar atelectasis. Chronic bronchitic change similar compared to prior. Nonunion of remote right proximal humeral fracture.  IMPRESSION: 1. Improved aeration in the lung bases likely secondary to a combination of better inspiratory volumes and decreased pleural effusion. 2. Stable cardiomegaly.   Electronically Signed   By: Malachy MoanHeath  McCullough M.D.   On: 12/24/2014 22:56   Dg Chest Port 1 View  12/23/2014   CLINICAL DATA:  Cough, congestion and shortness of breath. Swollen testicles for several days.  EXAM: PORTABLE CHEST - 1 VIEW  COMPARISON:  11/15/2014  FINDINGS: Sternotomy wires unchanged. Lungs are hypoinflated with mild prominence of the perihilar markings with minimal interval improvement. Findings suggest continued mild vascular congestion. Improving hazy opacification of the right base likely small effusion/atelectasis. Borderline cardiomegaly unchanged. There is calcified plaque over the aortic arch. Old right humeral fracture. Remainder the exam is unchanged.  IMPRESSION: Borderline cardiomegaly with mild vascular congestion slightly improved compared to the previous exam. Improved opacification over the right base likely small effusions/atelectasis.   Electronically Signed   By: Elberta Fortisaniel  Boyle M.D.   On: 12/23/2014 19:58    Microbiology: No results found for this or any previous visit (from the past 240 hour(s)).   Labs: Basic Metabolic Panel:  Recent Labs Lab 12/23/14 1940 12/24/14 0740 12/24/14 2207 12/26/14 1710  NA 138 140 137 139  K 4.8 4.4 4.8 3.8  CL 108 110 106 106  CO2 17* 19* 18* 22  GLUCOSE 111* 94 140* 105*  BUN 93* 99* 99* 106*  CREATININE 3.34* 3.38* 3.57* 3.47*  CALCIUM 8.3* 8.4* 8.4* 8.3*   Liver Function Tests:  Recent Labs Lab 12/24/14 0740  AST 14*  ALT 11*  ALKPHOS 127*  BILITOT 0.8  PROT 6.1*  ALBUMIN 3.0*   No results for input(s): LIPASE, AMYLASE in the last 168 hours. No results for  input(s): AMMONIA in the last 168 hours. CBC:  Recent Labs Lab 12/23/14 1940 12/24/14 0740  WBC 5.0 4.0  NEUTROABS  --  2.2  HGB 8.4* 8.0*  HCT 26.0* 24.6*  MCV 95.2 95.7  PLT 153 112*   Cardiac Enzymes:  Recent Labs Lab 12/23/14 2308 12/24/14 0740 12/24/14 1413  TROPONINI <0.03 <0.03 <0.03   BNP: BNP (last 3 results)  Recent Labs  11/12/14 1628 12/23/14 1940  BNP 192.2* 174.5*    ProBNP (last 3  results) No results for input(s): PROBNP in the last 8760 hours.  CBG:  Recent Labs Lab 12/26/14 1151 12/26/14 1714 12/26/14 2039 12/27/14 0732 12/27/14 1151  GLUCAP 169* 106* 153* 103* 124*       Signed:  Terri Malerba  [facility] 12/27/2014, 1:23 PM

## 2014-12-27 NOTE — Progress Notes (Signed)
Pt for discharge to [facility].   CSW facilitated pt discharge needs including contacting facility, faxing pt discharge information via [facility], discussing with pt at bedside and pt granddaughter, Marylene Landngela via telephone, providing RN phone number to call report, and arranging ambulance transport for [facility].   Pt granddaughter familiar with process of transitions to SNF as pt was recently at [facility]. Pt granddaughter eager to have palliative care services to follow at SNF. [facility] aware.   No further social work needs identified at this time.  CSW signing off.   Loletta SpecterSuzanna Kidd, MSW, LCSW Clinical Social Work [phone]

## 2014-12-27 NOTE — Clinical Social Work Placement (Signed)
   CLINICAL SOCIAL WORK PLACEMENT  NOTE  Date:  12/27/2014  Patient Details  Name: Eric Bowers MRN: [id number] Date of Birth: 09/20/1923  Clinical Social Work is seeking post-discharge placement for this patient at the Skilled  Nursing Facility level of care (*CSW will initial, date and re-position this form in  chart as items are completed):  Yes   Patient/family provided with [place] Clinical Social Work Department's list of facilities offering this level of care within the geographic area requested by the patient (or if unable, by the patient's family).  Yes   Patient/family informed of their freedom to choose among providers that offer the needed level of care, that participate in Medicare, Medicaid or managed care program needed by the patient, have an available bed and are willing to accept the patient.  Yes   Patient/family informed of [facility]'s ownership interest in [facility] and [facility], as well as of the fact that they are under no obligation to receive care at these facilities.  PASRR submitted to EDS on       PASRR number received on       Existing PASRR number confirmed on 12/25/14     FL2 transmitted to all facilities in geographic area requested by pt/family on 12/25/14     FL2 transmitted to all facilities within larger geographic area on       Patient informed that his/her managed care company has contracts with or will negotiate with certain facilities, including the following:        Yes   Patient/family informed of bed offers received.  [facility]     Physician recommends and patient chooses bed at      [facility].  Patient to be transferred to facility by ambulance [facility])     Patient family notified on 12/27/14 of transfer.  Name of family member notified:  pt notified at bedside and pt granddaughter, Marylene Landngela notified via telephone     PHYSICIAN Please  sign FL2, Please sign DNR     Additional Comment:    _______________________________________________ Eric Bowers, Eric A, LCSW 12/27/2014, 2:57 PM

## 2015-01-09 DEATH — deceased

## 2016-01-23 IMAGING — DX DG CHEST 1V PORT
1 series · 1 of 1 positions shown · non-contrast
Comparison: Prior chest x-ray 12/23/2014

CLINICAL DATA: 89-year-old male with bilateral upper extremity
edema worse on the right than the left

EXAM:
PORTABLE CHEST - 1 VIEW

[chest ap]
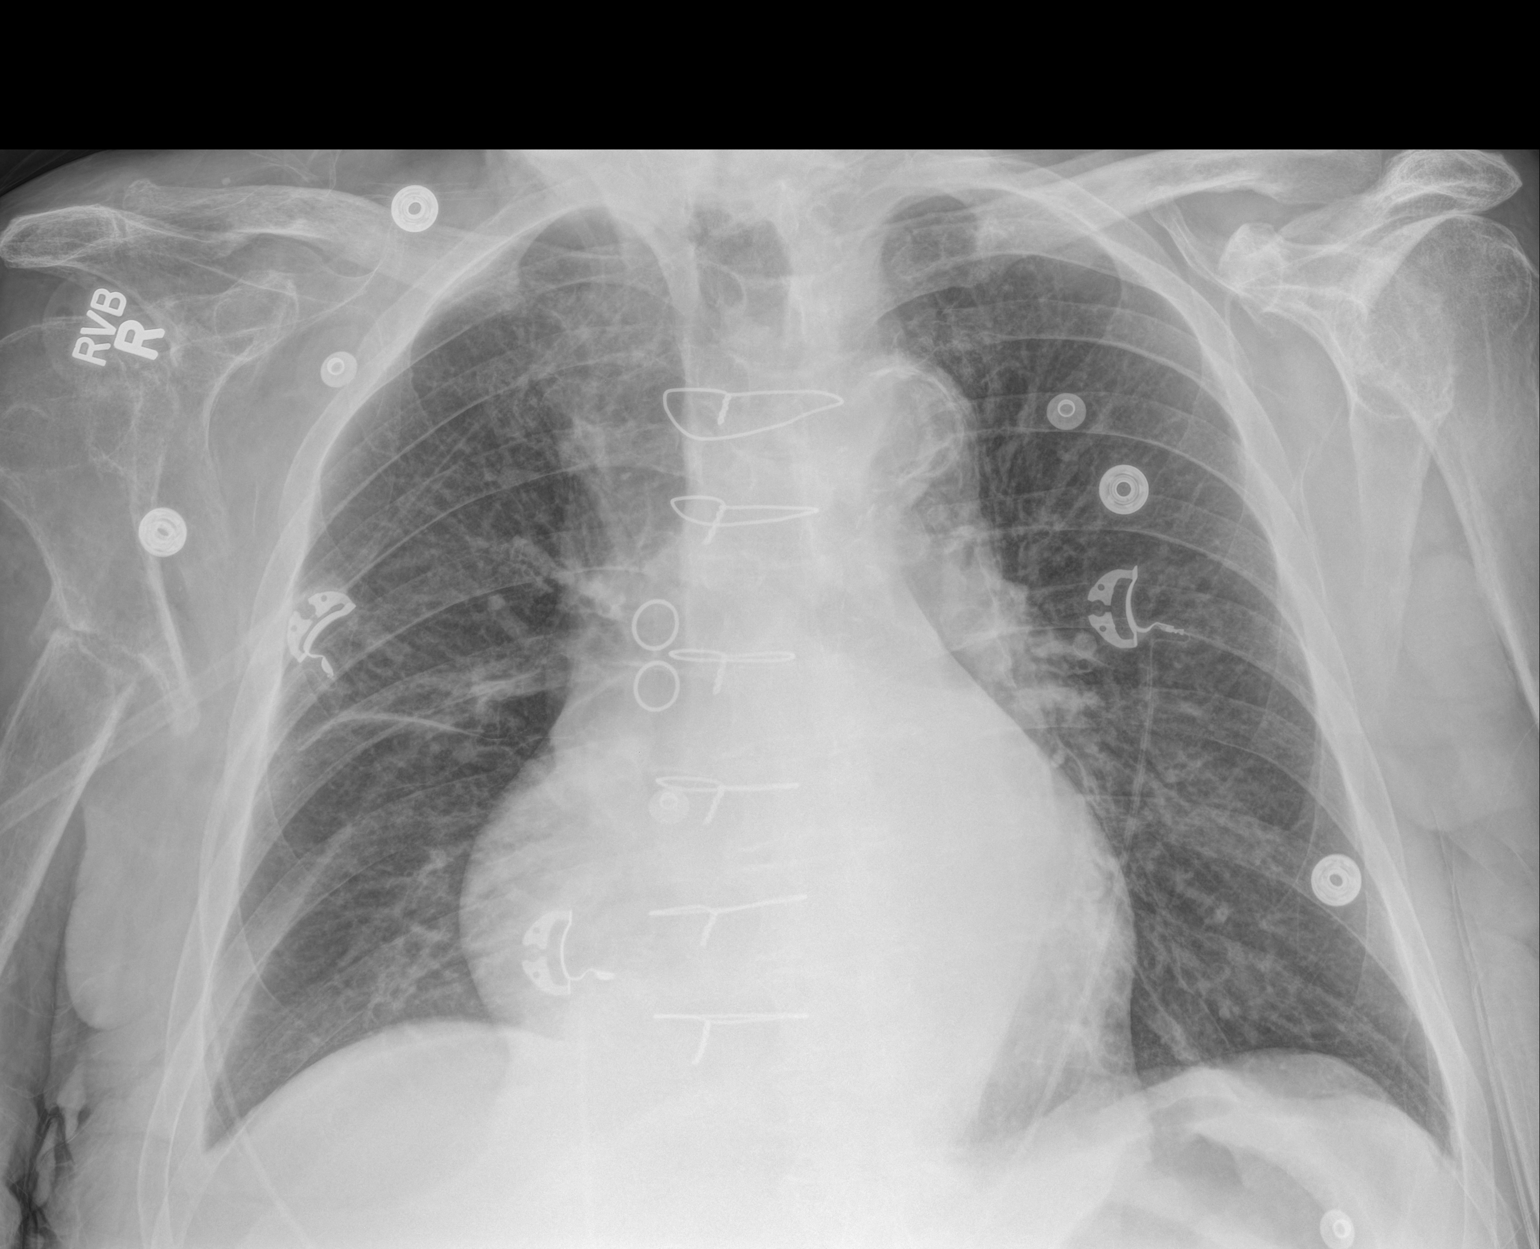

[1 of 1 positions shown; findings below may reference images not displayed]

FINDINGS: Stable cardiomegaly and aortic atherosclerosis. Patient is status
post median sternotomy with evidence of prior multivessel CABG.
Improved aeration in the lung bases since the prior chest x-ray.
Persistent residual small right pleural effusion and associated
right basilar atelectasis. Chronic bronchitic change similar
compared to prior. Nonunion of remote right proximal humeral
fracture.
IMPRESSION: 1. Improved aeration in the lung bases likely secondary to a
combination of better inspiratory volumes and decreased pleural
effusion.
2. Stable cardiomegaly.
